# Patient Record
Sex: Male | Born: 1985 | Race: Black or African American | Hispanic: No | Marital: Single | Smoking: Never smoker
Health system: Southern US, Community
[De-identification: ages and names within clinical notes are randomized; demographics above are authoritative.]

## PROBLEM LIST (undated history)

## (undated) DIAGNOSIS — M869 Osteomyelitis, unspecified: Secondary | ICD-10-CM

## (undated) DIAGNOSIS — D571 Sickle-cell disease without crisis: Secondary | ICD-10-CM

---
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
## 2007-09-01 ENCOUNTER — Emergency Department (HOSPITAL_COMMUNITY): Admission: EM | Admit: 2007-09-01 | Discharge: 2007-09-01 | Payer: Self-pay | Admitting: Emergency Medicine

## 2012-01-04 ENCOUNTER — Emergency Department (HOSPITAL_COMMUNITY)
Admission: EM | Admit: 2012-01-04 | Discharge: 2012-01-05 | Disposition: A | Discharge status: Home / Self Care | Payer: Self-pay | Attending: Emergency Medicine | Admitting: Emergency Medicine

## 2012-01-04 ENCOUNTER — Encounter (HOSPITAL_COMMUNITY): Payer: Self-pay | Admitting: Emergency Medicine

## 2012-01-04 ENCOUNTER — Emergency Department (HOSPITAL_COMMUNITY): Payer: Self-pay

## 2012-01-04 DIAGNOSIS — G8929 Other chronic pain: Secondary | ICD-10-CM | POA: Insufficient documentation

## 2012-01-04 DIAGNOSIS — M25559 Pain in unspecified hip: Secondary | ICD-10-CM | POA: Insufficient documentation

## 2012-01-04 DIAGNOSIS — D571 Sickle-cell disease without crisis: Secondary | ICD-10-CM | POA: Insufficient documentation

## 2012-01-04 DIAGNOSIS — M25569 Pain in unspecified knee: Secondary | ICD-10-CM | POA: Insufficient documentation

## 2012-01-04 HISTORY — DX: Sickle-cell disease without crisis: D57.1

## 2012-01-04 HISTORY — DX: Osteomyelitis, unspecified: M86.9

## 2012-01-04 NOTE — ED Notes (Signed)
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  St's has had pain since then unless he is on pain meds.  St's it hurts when he breaths

## 2012-01-04 NOTE — ED Notes (Signed)
Pt c/o pain to left knee.  Pt sleeping.

## 2012-01-05 MED ORDER — NAPROXEN 500 MG PO TABS: 500.0000 mg | ORAL_TABLET | Freq: Two times a day (BID) | ORAL | Status: AC

## 2012-01-05 MED ORDER — KETOROLAC TROMETHAMINE 60 MG/2ML IM SOLN
30.0000 mg | Freq: Once | INTRAMUSCULAR | Status: AC
  Administered 2012-01-05: 30 mg via INTRAMUSCULAR
  Filled 2012-01-05: qty 2

## 2012-01-05 NOTE — ED Notes (Signed)
Ortho tech here to apply knee sleeve.

## 2012-01-05 NOTE — ED Provider Notes (Signed)
History     CSN: [id number]  Arrival date & time 01/04/12  2047   First MD Initiated Contact with Patient 01/05/12 0005      Chief Complaint  Patient presents with  . Knee Pain    (Consider location/radiation/quality/duration/timing/severity/associated sxs/prior treatment) HPI Comments: Patient states he has chronic left knee pain.  One year ago was told he had osteomyelitis in the pain has been consistent daily walks.  Pain radiates to his hips.  He also states when he breathes.  He can feel pain in the veins of his left he buys Percocet off the street, which helps has no primary care doctor, although he has been in [place] for 6 years  Patient is a 26 y.o. male presenting with knee pain. The history is provided by the patient.  Knee Pain This is a chronic problem. The current episode started more than 1 year ago. The problem occurs constantly. The problem has been unchanged. Pertinent negatives include no coughing, joint swelling, numbness or weakness. The symptoms are aggravated by exertion. The treatment provided no relief.    Past Medical History  Diagnosis Date  . Osteomyelitis   . Sickle cell anemia     History reviewed. No pertinent past surgical history.  No family history on file.  History  Substance Use Topics  . Smoking status: Never Smoker   . Smokeless tobacco: Not on file  . Alcohol Use: Yes     sometimes      Review of Systems  Constitutional: Negative for activity change.  HENT: Negative.   Respiratory: Negative for cough.   Cardiovascular: Negative for leg swelling.  Musculoskeletal: Negative for joint swelling.  Neurological: Negative for weakness and numbness.  Hematological: Negative.     Allergies  Review of patient's allergies indicates no known allergies.  Home Medications   Current Outpatient Rx  Name Route Sig Dispense Refill  . NAPROXEN 500 MG PO TABS Oral Take 1 tablet (500 mg total) by mouth 2 (two) times daily. 30 tablet 0     BP 124/86  Pulse 115  Temp(Src) 99.2 F (37.3 C) (Oral)  Resp 20  SpO2 95%  Physical Exam  Constitutional: He is oriented to person, place, and time. He appears well-developed.  HENT:  Head: Normocephalic.  Neck: Normal range of motion.  Cardiovascular: Normal rate.   Pulmonary/Chest: Effort normal.  Musculoskeletal: Normal range of motion. He exhibits no edema and no tenderness.  Neurological: He is alert and oriented to person, place, and time.  Skin: Skin is warm.  Psychiatric: He has a normal mood and affect.    ED Course  Procedures (including critical care time)  Labs Reviewed - No data to display Dg Knee Complete 4 Views Left  01/04/2012  *RADIOLOGY REPORT*  Clinical Data: Left knee pain.  History of osteomyelitis and sickle cell anemia.  LEFT KNEE - COMPLETE 4+ VIEW  Comparison: None.  Findings: Alignment of the left knee is anatomic.  There is deformity of the lateral tibial plateau, suggesting prior healed tibial plateau fracture.  No effusion.  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  There is some cortical irregularity that may represent old fracture or non-ossifying fibroma/benign fibroxanthoma.  Medial joint space preserved.  IMPRESSION:  1.  Deformity of the lateral tibial plateau, suggesting healed tibial plateau fracture. 2.  Sclerosis of the distal femur with cortical irregularity. Differential considerations include healed fracture, bone infarcts associated with sickle cell and / or healed benign fibroxanthoma. 3.  No acute osseous abnormality.  Original Report Authenticated By: Andreas Newport, M.D.     1. Chronic knee pain     X-rays negative for any acute pathology.  On exam, there is no swelling, no crepitus, no deformity.  No erythema or for patient to orthopedics for further evaluation.  His chronic knee pain  MDM  Chronic knee pain        Arman Filter, NP 01/05/12 0102  Arman Filter, NP 01/05/12 1610

## 2012-01-13 NOTE — ED Provider Notes (Signed)
Medical screening examination/treatment/procedure(s) were performed by non-physician practitioner and as supervising physician I was immediately available for consultation/collaboration.    Akili Cuda L Ryan Ogborn, MD 01/13/12 2249 

## 2013-06-12 LAB — PROCEDURE REPORT - SCANNED: Pap: NEGATIVE

## 2016-09-20 ENCOUNTER — Encounter: Payer: Self-pay | Admitting: *Deleted

## 2019-10-13 ENCOUNTER — Emergency Department (HOSPITAL_COMMUNITY): Payer: Self-pay

## 2019-10-13 ENCOUNTER — Inpatient Hospital Stay (HOSPITAL_COMMUNITY)
Admission: EM | Admit: 2019-10-13 | Discharge: 2019-10-16 | DRG: 872 | Disposition: A | Discharge status: Home / Self Care | Payer: Self-pay | Attending: Internal Medicine | Admitting: Internal Medicine

## 2019-10-13 ENCOUNTER — Encounter (HOSPITAL_COMMUNITY): Payer: Self-pay

## 2019-10-13 ENCOUNTER — Other Ambulatory Visit: Payer: Self-pay

## 2019-10-13 DIAGNOSIS — M542 Cervicalgia: Secondary | ICD-10-CM | POA: Diagnosis present

## 2019-10-13 DIAGNOSIS — D509 Iron deficiency anemia, unspecified: Secondary | ICD-10-CM | POA: Diagnosis present

## 2019-10-13 DIAGNOSIS — D5701 Hb-SS disease with acute chest syndrome: Secondary | ICD-10-CM | POA: Insufficient documentation

## 2019-10-13 DIAGNOSIS — D72819 Decreased white blood cell count, unspecified: Secondary | ICD-10-CM | POA: Diagnosis present

## 2019-10-13 DIAGNOSIS — Z5900 Homelessness unspecified: Secondary | ICD-10-CM

## 2019-10-13 DIAGNOSIS — A419 Sepsis, unspecified organism: Principal | ICD-10-CM | POA: Diagnosis present

## 2019-10-13 DIAGNOSIS — K0889 Other specified disorders of teeth and supporting structures: Secondary | ICD-10-CM | POA: Diagnosis present

## 2019-10-13 DIAGNOSIS — D649 Anemia, unspecified: Secondary | ICD-10-CM

## 2019-10-13 DIAGNOSIS — Z20828 Contact with and (suspected) exposure to other viral communicable diseases: Secondary | ICD-10-CM | POA: Diagnosis present

## 2019-10-13 DIAGNOSIS — D573 Sickle-cell trait: Secondary | ICD-10-CM | POA: Diagnosis present

## 2019-10-13 DIAGNOSIS — Z20822 Contact with and (suspected) exposure to covid-19: Secondary | ICD-10-CM | POA: Insufficient documentation

## 2019-10-13 LAB — SARS CORONAVIRUS 2 (TAT 6-24 HRS): SARS Coronavirus 2: NEGATIVE

## 2019-10-13 LAB — CBC WITH DIFFERENTIAL/PLATELET
Abs Immature Granulocytes: 0.01 10*3/uL (ref 0.00–0.07)
Basophils Absolute: 0 10*3/uL (ref 0.0–0.1)
Basophils Relative: 0 %
Eosinophils Absolute: 0 10*3/uL (ref 0.0–0.5)
Eosinophils Relative: 0 %
HCT: 20.2 % — ABNORMAL LOW (ref 39.0–52.0)
Hemoglobin: 5.2 g/dL — CL (ref 13.0–17.0)
Immature Granulocytes: 0 %
Lymphocytes Relative: 5 %
Lymphs Abs: 0.1 10*3/uL — ABNORMAL LOW (ref 0.7–4.0)
MCH: 17.1 pg — ABNORMAL LOW (ref 26.0–34.0)
MCHC: 25.7 g/dL — ABNORMAL LOW (ref 30.0–36.0)
MCV: 66.4 fL — ABNORMAL LOW (ref 80.0–100.0)
Monocytes Absolute: 0 10*3/uL — ABNORMAL LOW (ref 0.1–1.0)
Monocytes Relative: 1 %
Neutro Abs: 2.5 10*3/uL (ref 1.7–7.7)
Neutrophils Relative %: 94 %
Platelets: 247 10*3/uL (ref 150–400)
RBC: 3.04 MIL/uL — ABNORMAL LOW (ref 4.22–5.81)
RDW: 22.5 % — ABNORMAL HIGH (ref 11.5–15.5)
WBC: 2.7 10*3/uL — ABNORMAL LOW (ref 4.0–10.5)
nRBC: 4.1 % — ABNORMAL HIGH (ref 0.0–0.2)

## 2019-10-13 LAB — URINALYSIS, ROUTINE W REFLEX MICROSCOPIC
Bilirubin Urine: NEGATIVE
Glucose, UA: NEGATIVE mg/dL
Hgb urine dipstick: NEGATIVE
Ketones, ur: NEGATIVE mg/dL
Leukocytes,Ua: NEGATIVE
Nitrite: NEGATIVE
Protein, ur: NEGATIVE mg/dL
Specific Gravity, Urine: 1.013 (ref 1.005–1.030)
pH: 6 (ref 5.0–8.0)

## 2019-10-13 LAB — HEPATIC FUNCTION PANEL
ALT: 20 U/L (ref 0–44)
AST: 79 U/L — ABNORMAL HIGH (ref 15–41)
Albumin: 3.4 g/dL — ABNORMAL LOW (ref 3.5–5.0)
Alkaline Phosphatase: 45 U/L (ref 38–126)
Bilirubin, Direct: 0.1 mg/dL (ref 0.0–0.2)
Indirect Bilirubin: 0.5 mg/dL (ref 0.3–0.9)
Total Bilirubin: 0.6 mg/dL (ref 0.3–1.2)
Total Protein: 6.4 g/dL — ABNORMAL LOW (ref 6.5–8.1)

## 2019-10-13 LAB — IRON AND TIBC
Iron: 11 ug/dL — ABNORMAL LOW (ref 45–182)
Saturation Ratios: 3 % — ABNORMAL LOW (ref 17.9–39.5)
TIBC: 419 ug/dL (ref 250–450)
UIBC: 408 ug/dL

## 2019-10-13 LAB — RETICULOCYTES
Immature Retic Fract: 18 % — ABNORMAL HIGH (ref 2.3–15.9)
RBC.: 2.99 MIL/uL — ABNORMAL LOW (ref 4.22–5.81)
Retic Count, Absolute: 20.6 10*3/uL (ref 19.0–186.0)
Retic Ct Pct: 0.7 % (ref 0.4–3.1)

## 2019-10-13 LAB — PREPARE RBC (CROSSMATCH)

## 2019-10-13 LAB — BASIC METABOLIC PANEL
Anion gap: 11 (ref 5–15)
BUN: 12 mg/dL (ref 6–20)
CO2: 21 mmol/L — ABNORMAL LOW (ref 22–32)
Calcium: 7.8 mg/dL — ABNORMAL LOW (ref 8.9–10.3)
Chloride: 107 mmol/L (ref 98–111)
Creatinine, Ser: 0.91 mg/dL (ref 0.61–1.24)
GFR calc Af Amer: >60 mL/min (ref >60)
GFR calc non Af Amer: >60 mL/min (ref >60)
Glucose, Bld: 74 mg/dL (ref 70–99)
Potassium: 4 mmol/L (ref 3.5–5.1)
Sodium: 139 mmol/L (ref 135–145)

## 2019-10-13 LAB — LACTIC ACID, PLASMA
Lactic Acid, Venous: 2.6 mmol/L (ref 0.5–1.9)
Lactic Acid, Venous: 2.4 mmol/L (ref 0.5–1.9)

## 2019-10-13 LAB — FOLATE: Folate: 8.3 ng/mL (ref >5.9)

## 2019-10-13 LAB — POC OCCULT BLOOD, ED: Fecal Occult Bld: NEGATIVE

## 2019-10-13 LAB — PROTIME-INR
INR: 1.9 — ABNORMAL HIGH (ref 0.8–1.2)
Prothrombin Time: 21.6 seconds — ABNORMAL HIGH (ref 11.4–15.2)

## 2019-10-13 LAB — VITAMIN B12: Vitamin B-12: 338 pg/mL (ref 180–914)

## 2019-10-13 LAB — FERRITIN: Ferritin: 63 ng/mL (ref 24–336)

## 2019-10-13 LAB — LIPASE, BLOOD: Lipase: 39 U/L (ref 11–51)

## 2019-10-13 LAB — APTT: aPTT: 30 seconds (ref 24–36)

## 2019-10-13 LAB — ABO/RH: ABO/RH(D): O POS

## 2019-10-13 MED ORDER — AZITHROMYCIN 250 MG PO TABS
500.0000 mg | ORAL_TABLET | Freq: Every day | ORAL | Status: DC
  Administered 2019-10-14 – 2019-10-16 (×3): 500 mg via ORAL
  Filled 2019-10-13 (×3): qty 2

## 2019-10-13 MED ORDER — ENOXAPARIN SODIUM 40 MG/0.4ML ~~LOC~~ SOLN
40.0000 mg | SUBCUTANEOUS | Status: DC
  Filled 2019-10-13 (×2): qty 0.4

## 2019-10-13 MED ORDER — MORPHINE SULFATE (PF) 4 MG/ML IV SOLN: 4.0000 mg | Freq: Once | INTRAVENOUS | Status: DC

## 2019-10-13 MED ORDER — ACETAMINOPHEN 325 MG PO TABS
650.0000 mg | ORAL_TABLET | Freq: Once | ORAL | Status: AC
  Administered 2019-10-13: 11:00:00 650 mg via ORAL
  Filled 2019-10-13: qty 2

## 2019-10-13 MED ORDER — SODIUM CHLORIDE 0.9 % IV SOLN
1.0000 g | Freq: Once | INTRAVENOUS | Status: AC
  Administered 2019-10-13: 12:00:00 1 g via INTRAVENOUS
  Filled 2019-10-13: qty 10

## 2019-10-13 MED ORDER — SODIUM CHLORIDE 0.9 % IV SOLN
1.0000 g | INTRAVENOUS | Status: DC
  Administered 2019-10-14 – 2019-10-15 (×2): 1 g via INTRAVENOUS
  Filled 2019-10-13 (×2): qty 1
  Filled 2019-10-13: qty 10

## 2019-10-13 MED ORDER — ONDANSETRON HCL 4 MG/2ML IJ SOLN
4.0000 mg | Freq: Once | INTRAMUSCULAR | Status: AC
  Administered 2019-10-13: 4 mg via INTRAVENOUS
  Filled 2019-10-13: qty 2

## 2019-10-13 MED ORDER — SODIUM CHLORIDE 0.9% IV SOLUTION: Freq: Once | INTRAVENOUS | Status: DC

## 2019-10-13 MED ORDER — PROMETHAZINE HCL 25 MG PO TABS: 12.5000 mg | ORAL_TABLET | ORAL | Status: DC | PRN

## 2019-10-13 MED ORDER — BENZOCAINE 10 % MT GEL
Freq: Four times a day (QID) | OROMUCOSAL | Status: DC | PRN
  Filled 2019-10-13: qty 9.4

## 2019-10-13 MED ORDER — KETOROLAC TROMETHAMINE 15 MG/ML IJ SOLN
15.0000 mg | Freq: Four times a day (QID) | INTRAMUSCULAR | Status: DC
  Administered 2019-10-13 – 2019-10-15 (×6): 15 mg via INTRAVENOUS
  Filled 2019-10-13 (×10): qty 1

## 2019-10-13 MED ORDER — LIDOCAINE VISCOUS HCL 2 % MT SOLN
15.0000 mL | Freq: Once | OROMUCOSAL | Status: AC
  Administered 2019-10-13: 15 mL via OROMUCOSAL
  Filled 2019-10-13: qty 15

## 2019-10-13 MED ORDER — AZITHROMYCIN 250 MG PO TABS
500.0000 mg | ORAL_TABLET | Freq: Once | ORAL | Status: AC
  Administered 2019-10-13: 12:00:00 500 mg via ORAL
  Filled 2019-10-13: qty 2

## 2019-10-13 MED ORDER — SODIUM CHLORIDE (PF) 0.9 % IJ SOLN
INTRAMUSCULAR | Status: AC
  Filled 2019-10-13: qty 50

## 2019-10-13 MED ORDER — PROMETHAZINE HCL 25 MG RE SUPP: 12.5000 mg | RECTAL | Status: DC | PRN

## 2019-10-13 MED ORDER — SODIUM CHLORIDE 0.9 % IV BOLUS
1000.0000 mL | Freq: Once | INTRAVENOUS | Status: AC
  Administered 2019-10-13: 11:00:00 1000 mL via INTRAVENOUS

## 2019-10-13 MED ORDER — FOLIC ACID 1 MG PO TABS
1.0000 mg | ORAL_TABLET | Freq: Every day | ORAL | Status: DC
  Administered 2019-10-14 – 2019-10-16 (×3): 1 mg via ORAL
  Filled 2019-10-13 (×3): qty 1

## 2019-10-13 MED ORDER — IOHEXOL 300 MG/ML  SOLN
100.0000 mL | Freq: Once | INTRAMUSCULAR | Status: AC | PRN
  Administered 2019-10-13: 100 mL via INTRAVENOUS

## 2019-10-13 MED ORDER — SODIUM CHLORIDE 0.9% IV SOLUTION
Freq: Once | INTRAVENOUS | Status: AC
  Administered 2019-10-13: 22:00:00 via INTRAVENOUS

## 2019-10-13 MED ORDER — SODIUM CHLORIDE 0.45 % IV SOLN
INTRAVENOUS | Status: DC
  Administered 2019-10-13 – 2019-10-15 (×3): via INTRAVENOUS

## 2019-10-13 MED ORDER — POLYETHYLENE GLYCOL 3350 17 G PO PACK: 17.0000 g | PACK | Freq: Every day | ORAL | Status: DC | PRN

## 2019-10-13 MED ORDER — SENNOSIDES-DOCUSATE SODIUM 8.6-50 MG PO TABS
1.0000 | ORAL_TABLET | Freq: Two times a day (BID) | ORAL | Status: DC
  Administered 2019-10-14 – 2019-10-16 (×5): 1 via ORAL
  Filled 2019-10-13 (×6): qty 1

## 2019-10-13 MED ORDER — ONDANSETRON HCL 4 MG/2ML IJ SOLN
INTRAMUSCULAR | Status: AC
  Filled 2019-10-13: qty 2

## 2019-10-13 NOTE — ED Triage Notes (Signed)
He c/o feeling "cold" x 6 hours. He seems angry and reluctant to cooperate. He is in no distress. He is quite rude. PA with pt. As I write this.

## 2019-10-13 NOTE — H&P (Signed)
H&P  Patient Demographics:  Alfred Phillips, is a 33 y.o. male  MRN: [id number]   DOB - Sep 20, 1986  Admit Date - 10/13/2019  Outpatient Primary MD for the patient is Patient, No Pcp Per  Chief Complaint  Patient presents with  . possible covid-19     HPI:   Alfred Phillips  is a 33 y.o. male patient with possible history of sickle cell disease, patient unsure if he has sickle cell trait or sickle cell anemia but according to him has always had low hemoglobin.  Has not been to the doctor for over 10 years.  He presented to the emergency room today with symptoms of fever, chills, generalized body ache, pains, sore throat, and diffuse abdominal pain, associated with nausea, vomiting and decreased in taste and smell.  He is very sure he has not been in contact with any patient diagnosed with COVID-19, but unsure of exposure to asymptomatic and unconfirmed diagnosis.  He denies any recent travel.  He does not have cough or shortness of breath.  He denies any urinary symptom or change in bowel habit.  He is not on any medication.  He claims when he was younger he used to be given some herbal concoctions to bring up his hemoglobin level. Patient is borderline homeless, per EMS he was picked up at a laundromat.   ED course: Patient was found to be anemic with hemoglobin of 5.2.  There was no prior hemoglobin level for comparison.  White cell cell count was low at 2.7. Patient was febrile with T max of 103*F, soft BP of 95/60, HR of 105, initial Lactic Acid was 2.6. Patient will be admitted for SIRS, symptomatic Iron Def Anemia, and possible sickle cell disease.   Review of systems:  In addition to the HPI above, patient reports No Headache, No changes with vision or hearing No problems swallowing food or liquids No chest pain, cough or shortness of breath No blood in stool or urine No dysuria No new skin rashes or bruises No new joints pains-aches No new weakness, tingling, numbness in any extremity No  recent weight gain or loss No polyuria, polydypsia or polyphagia No significant Mental Stressors  A full 10 point Review of Systems was done, except as stated above, all other Review of Systems were negative.  With Past History of the following :   Past Medical History:  Diagnosis Date  . Osteomyelitis (HCC)   . Sickle cell anemia (HCC)       No past surgical history on file.   Social History:   Social History   Tobacco Use  . Smoking status: Never Smoker  Substance Use Topics  . Alcohol use: Yes    Comment: sometimes     Lives - At home   Family History :   No family history on file.   Home Medications:   Prior to Admission medications   Not on File     Allergies:   No Known Allergies   Physical Exam:   Vitals:   Vitals:   10/13/19 1301 10/13/19 1544  BP: 95/60 118/81  Pulse: (!) 105 93  Resp: 18 18  Temp:    SpO2: 100% 100%   Physical Exam: Constitutional: Patient appears well-developed and well-nourished. Not in obvious distress. Pale, warm to touch HENT: Normocephalic, atraumatic, External right and left ear normal. Oropharynx is clear and moist.  Eyes: Conjunctivae and EOM are normal. PERRLA, no scleral icterus. Neck: Normal ROM. Neck supple. No JVD. No  tracheal deviation. No thyromegaly. CVS: RRR, S1/S2 +, no murmurs, no gallops, no carotid bruit.  Pulmonary: Effort and breath sounds normal, no stridor, rhonchi, wheezes, rales.  Abdominal: Soft. BS +, no distension, tenderness, rebound or guarding.  Musculoskeletal: Normal range of motion. No edema and no tenderness.  Lymphadenopathy: No lymphadenopathy noted, cervical, inguinal or axillary Neuro: Alert. Normal reflexes, muscle tone coordination. No cranial nerve deficit. Skin: Skin is warm and dry. No rash noted. Not diaphoretic. No erythema. No pallor. Psychiatric: Normal mood and affect. Behavior, judgment, thought content normal.   Data Review:   CBC Recent Labs  Lab 10/13/19 1046   WBC 2.7*  HGB 5.2*  HCT 20.2*  PLT 247  MCV 66.4*  MCH 17.1*  MCHC 25.7*  RDW 22.5*  LYMPHSABS 0.1*  MONOABS 0.0*  EOSABS 0.0  BASOSABS 0.0   ------------------------------------------------------------------------------------------------------------------  Chemistries  Recent Labs  Lab 10/13/19 1046  NA 139  K 4.0  CL 107  CO2 21*  GLUCOSE 74  BUN 12  CREATININE 0.91  CALCIUM 7.8*   ------------------------------------------------------------------------------------------------------------------ CrCl cannot be calculated (Unknown ideal weight.). ------------------------------------------------------------------------------------------------------------------ No results for input(s): TSH, T4TOTAL, T3FREE, THYROIDAB in the last 72 hours.  Invalid input(s): FREET3  Coagulation profile No results for input(s): INR, PROTIME in the last 168 hours. ------------------------------------------------------------------------------------------------------------------- No results for input(s): DDIMER in the last 72 hours. -------------------------------------------------------------------------------------------------------------------  Cardiac Enzymes No results for input(s): CKMB, TROPONINI, MYOGLOBIN in the last 168 hours.  Invalid input(s): CK ------------------------------------------------------------------------------------------------------------------ No results found for: BNP  ---------------------------------------------------------------------------------------------------------------  Urinalysis    Component Value Date/Time   COLORURINE YELLOW 10/13/2019 1004   APPEARANCEUR CLEAR 10/13/2019 1004   LABSPEC 1.013 10/13/2019 1004   PHURINE 6.0 10/13/2019 1004   GLUCOSEU NEGATIVE 10/13/2019 1004   HGBUR NEGATIVE 10/13/2019 1004   BILIRUBINUR NEGATIVE 10/13/2019 1004   KETONESUR NEGATIVE 10/13/2019 1004   PROTEINUR NEGATIVE 10/13/2019 1004   NITRITE NEGATIVE  10/13/2019 1004   LEUKOCYTESUR NEGATIVE 10/13/2019 1004    ----------------------------------------------------------------------------------------------------------------   Imaging Results:    Ct Abdomen Pelvis W Contrast  Result Date: 10/13/2019 CLINICAL DATA:  33 year old male with history of diffuse abdominal pain with nausea and vomiting. Fever to 103 degrees. Rectal bleeding. EXAM: CT ABDOMEN AND PELVIS WITH CONTRAST TECHNIQUE: Multidetector CT imaging of the abdomen and pelvis was performed using the standard protocol following bolus administration of intravenous contrast. CONTRAST:  OMNIPAQUE IOHEXOL 300 MG/ML  SOLN COMPARISON:  No priors. FINDINGS: Lower chest: Unremarkable. Hepatobiliary: No suspicious cystic or solid hepatic lesions. No intra or extrahepatic biliary ductal dilatation. Gallbladder is normal in appearance. Pancreas: No pancreatic mass. No pancreatic ductal dilatation. No pancreatic or peripancreatic fluid collections or inflammatory changes. Spleen: Unremarkable. Adrenals/Urinary Tract: Bilateral kidneys and adrenal glands are normal in appearance. No hydroureteronephrosis. Urinary bladder is normal in appearance. Stomach/Bowel: Normal appearance of the stomach. No pathologic dilatation of small bowel or colon. The appendix is not confidently identified and may be surgically absent. Regardless, there are no inflammatory changes noted adjacent to the cecum to suggest the presence of an acute appendicitis at this time. Prominent hemorrhoidal veins adjacent to the distal rectum. Vascular/Lymphatic: No significant atherosclerotic disease, aneurysm or dissection noted in the abdominal or pelvic vasculature. No lymphadenopathy noted in the abdomen or pelvis. Reproductive: Prostate gland and seminal vesicles are unremarkable in appearance. Other: No significant volume of ascites.  No pneumoperitoneum. Musculoskeletal: There are no aggressive appearing lytic or blastic lesions  noted in the visualized portions of the skeleton. IMPRESSION: 1. No acute  findings are noted to account for the patient's abdominal pain, nausea or vomiting. 2. There are prominent hemorrhoidal veins adjacent to the distal rectum, which may account for the patient's rectal bleeding. Electronically Signed   By: Trudie Reed M.D.   On: 10/13/2019 13:05   Dg Chest Portable 1 View  Result Date: 10/13/2019 CLINICAL DATA:  Fever, chills, headache, nausea/vomiting EXAM: PORTABLE CHEST 1 VIEW COMPARISON:  None. FINDINGS: Lungs are clear.  No pleural effusion or pneumothorax. The heart is normal in size. IMPRESSION: No evidence of acute cardiopulmonary disease. Electronically Signed   By: Charline Bills M.D.   On: 10/13/2019 10:37    Assessment & Plan:  Active Problems:   Sepsis (HCC)   Symptomatic anemia  1. Symptomatic Iron Deficiency Anemia: After much education and discussion of benefits and risks, patient gave informed consent to blood transfusion. Patient will be transfused with 2 units of PRBC. Monitor closely. Folic acid and Iron. 2. Systemic Inflammatory Response Syndrome: Patient has fever, leucopenia, tachycardia and tachypnea. COVID19 testing ordered, blood and urine cultures, CXR. Start empiric antibiotics; IV Ceftriaxone and Azithromycin. Monitor vitals closely, repeat lactic acid and CBCD 3. Possible Sickle Cell Disease: Hemoglobinopathy Screening ordered. Patient unsure of diagnosis, he told me it could be sickle cell trait but he use to be admitted frequently as a child and later treated with herbal remedies to raise his blood level and has since been stable. Not being to a doctor for over 10 years and not on any medication. IVF D5 .45% Saline, prn pain control, acetaminophen for fever. Monitor vitals very closely, Re-evaluate pain scale regularly, 2 L of Oxygen by Gramling, Patient will be re-evaluated for pain in the context of function and relationship to baseline as care  progresses. 4. Leukopenia: Check HIV,COVID19, recheck CBCD. IV antibiotics meanwhile 5. Homelessness: Child psychotherapist consult  DVT Prophylaxis: Subcut Lovenox   AM Labs Ordered, also please review Full Orders  Family Communication: Admission, patient's condition and plan of care including tests being ordered have been discussed with the patient who indicate understanding and agree with the plan and Code Status.  Code Status: Full Code  Consults called: None    Admission status: Inpatient    Time spent in minutes : 50 minutes  Jeanann Lewandowsky MD, MHA, CPE, FACP 10/13/2019 at 3:52 PM

## 2019-10-13 NOTE — ED Provider Notes (Addendum)
[facility] DEPT Provider Note   CSN: [id number] Arrival date & time: 10/13/19  7654     History   Chief Complaint Chief Complaint  Patient presents with   possible covid-19    HPI Alfred Phillips is a 33 y.o. male.     The history is provided by the patient. No language interpreter was used.     33 year old male with history of sickle cell disease brought here via EMS from a laundromat with symptoms concerning for COVID-19.  History is difficult to obtain as patient is uncooperative.  Patient report for the past 1 day he has had fever, chills, body ache, sore throat, dehydration, diffuse abdominal discomfort, nausea, vomiting and decrease in taste and smell.  He is unsure if he has been around anyone that test positive COVID-19.  He denies any recent travel.  He does not complain of any shortness of breath.  He is insistent on having something to drink.  He denies any dysuria.  No specific treatment tried.  Past Medical History:  Diagnosis Date   Osteomyelitis    Sickle cell anemia     There are no active problems to display for this patient.   No past surgical history on file.      Home Medications    Prior to Admission medications   Not on File    Family History No family history on file.  Social History Social History   Tobacco Use   Smoking status: Never Smoker  Substance Use Topics   Alcohol use: Yes    Comment: sometimes   Drug use: No     Allergies   Patient has no known allergies.   Review of Systems Review of Systems  All other systems reviewed and are negative.    Physical Exam Updated Vital Signs BP (!) 110/54    Pulse (!) 119    Temp (!) 103 F (39.4 C) (Oral)    SpO2 99%   Physical Exam Vitals signs and nursing note reviewed.  Constitutional:      General: He is not in acute distress.    Appearance: He is well-developed.     Comments: Appears uncomfortable but nontoxic  HENT:     Head:  Atraumatic.  Eyes:     Conjunctiva/sclera: Conjunctivae normal.  Neck:     Musculoskeletal: Normal range of motion and neck supple. No neck rigidity.  Cardiovascular:     Rate and Rhythm: Tachycardia present.     Pulses: Normal pulses.     Heart sounds: Normal heart sounds.  Pulmonary:     Effort: Pulmonary effort is normal.     Breath sounds: Normal breath sounds. No wheezing, rhonchi or rales.  Abdominal:     Palpations: Abdomen is soft.     Tenderness: There is abdominal tenderness (Mild diffuse abdominal tenderness without guarding or rebound tenderness.  Negative Murphy sign, no pain at McBurney's point.).  Genitourinary:    Comments: Chaperone present during exam.  Normal rectal tone, no obvious mass, no thrombosed hemorrhoid, palpable internal hemorrhoid, no visible hemorrhoid, no blood on glove. Skin:    General: Skin is warm.     Findings: No rash.  Neurological:     Mental Status: He is alert and oriented to person, place, and time.  Psychiatric:     Comments: Agitated.      ED Treatments / Results  Labs (all labs ordered are listed, but only abnormal results are displayed) Labs Reviewed  CBC WITH  DIFFERENTIAL/PLATELET - Abnormal; Notable for the following components:      Result Value   WBC 2.7 (*)    RBC 3.04 (*)    Hemoglobin 5.2 (*)    HCT 20.2 (*)    MCV 66.4 (*)    MCH 17.1 (*)    MCHC 25.7 (*)    RDW 22.5 (*)    nRBC 4.1 (*)    Lymphs Abs 0.1 (*)    Monocytes Absolute 0.0 (*)    All other components within normal limits  BASIC METABOLIC PANEL - Abnormal; Notable for the following components:   CO2 21 (*)    Calcium 7.8 (*)    All other components within normal limits  LACTIC ACID, PLASMA - Abnormal; Notable for the following components:   Lactic Acid, Venous 2.6 (*)    All other components within normal limits  LACTIC ACID, PLASMA - Abnormal; Notable for the following components:   Lactic Acid, Venous 2.4 (*)    All other components within  normal limits  RETICULOCYTES - Abnormal; Notable for the following components:   RBC. 2.99 (*)    Immature Retic Fract 18.0 (*)    All other components within normal limits  SARS CORONAVIRUS 2 (TAT 6-24 HRS)  LIPASE, BLOOD  URINALYSIS, ROUTINE W REFLEX MICROSCOPIC  POC OCCULT BLOOD, ED    EKG None  Radiology Ct Abdomen Pelvis W Contrast  Result Date: 10/13/2019 CLINICAL DATA:  33 year old male with history of diffuse abdominal pain with nausea and vomiting. Fever to 103 degrees. Rectal bleeding. EXAM: CT ABDOMEN AND PELVIS WITH CONTRAST TECHNIQUE: Multidetector CT imaging of the abdomen and pelvis was performed using the standard protocol following bolus administration of intravenous contrast. CONTRAST:  OMNIPAQUE IOHEXOL 300 MG/ML  SOLN COMPARISON:  No priors. FINDINGS: Lower chest: Unremarkable. Hepatobiliary: No suspicious cystic or solid hepatic lesions. No intra or extrahepatic biliary ductal dilatation. Gallbladder is normal in appearance. Pancreas: No pancreatic mass. No pancreatic ductal dilatation. No pancreatic or peripancreatic fluid collections or inflammatory changes. Spleen: Unremarkable. Adrenals/Urinary Tract: Bilateral kidneys and adrenal glands are normal in appearance. No hydroureteronephrosis. Urinary bladder is normal in appearance. Stomach/Bowel: Normal appearance of the stomach. No pathologic dilatation of small bowel or colon. The appendix is not confidently identified and may be surgically absent. Regardless, there are no inflammatory changes noted adjacent to the cecum to suggest the presence of an acute appendicitis at this time. Prominent hemorrhoidal veins adjacent to the distal rectum. Vascular/Lymphatic: No significant atherosclerotic disease, aneurysm or dissection noted in the abdominal or pelvic vasculature. No lymphadenopathy noted in the abdomen or pelvis. Reproductive: Prostate gland and seminal vesicles are unremarkable in appearance. Other: No  significant volume of ascites.  No pneumoperitoneum. Musculoskeletal: There are no aggressive appearing lytic or blastic lesions noted in the visualized portions of the skeleton. IMPRESSION: 1. No acute findings are noted to account for the patient's abdominal pain, nausea or vomiting. 2. There are prominent hemorrhoidal veins adjacent to the distal rectum, which may account for the patient's rectal bleeding. Electronically Signed   By: Trudie Reed M.D.   On: 10/13/2019 13:05   Dg Chest Portable 1 View  Result Date: 10/13/2019 CLINICAL DATA:  Fever, chills, headache, nausea/vomiting EXAM: PORTABLE CHEST 1 VIEW COMPARISON:  None. FINDINGS: Lungs are clear.  No pleural effusion or pneumothorax. The heart is normal in size. IMPRESSION: No evidence of acute cardiopulmonary disease. Electronically Signed   By: Charline Bills M.D.   On: 10/13/2019 10:37  Procedures .Critical Care Performed by: Fayrene Helperran, Faolan [place], PA-C Authorized by: Fayrene Helperran, Kross Swallows, PA-C   Critical care provider statement:    Critical care time (minutes):  45   Critical care was time spent personally by me on the following activities:  Discussions with consultants, evaluation of patient's response to treatment, examination of patient, ordering and performing treatments and interventions, ordering and review of laboratory studies, ordering and review of radiographic studies, pulse oximetry, re-evaluation of patient's condition, obtaining history from patient or surrogate and review of old charts   (including critical care time)  Medications Ordered in ED Medications  0.9 %  sodium chloride infusion (Manually program via Guardrails IV Fluids) (has no administration in time range)  sodium chloride (PF) 0.9 % injection (has no administration in time range)  sodium chloride 0.9 % bolus 1,000 mL (0 mLs Intravenous Stopped 10/13/19 1155)  acetaminophen (TYLENOL) tablet 650 mg (650 mg Oral Given 10/13/19 1048)  ondansetron (ZOFRAN)  injection 4 mg (4 mg Intravenous Given 10/13/19 1054)  lidocaine (XYLOCAINE) 2 % viscous mouth solution 15 mL (15 mLs Mouth/Throat Given 10/13/19 1048)  cefTRIAXone (ROCEPHIN) 1 g in sodium chloride 0.9 % 100 mL IVPB (0 g Intravenous Stopped 10/13/19 1307)  azithromycin (ZITHROMAX) tablet 500 mg (500 mg Oral Given 10/13/19 1214)  iohexol (OMNIPAQUE) 300 MG/ML solution 100 mL (100 mLs Intravenous Contrast Given 10/13/19 1231)     Initial Impression / Assessment and Plan / ED Course  I have reviewed the triage vital signs and the nursing notes.  Pertinent labs & imaging results that were available during my care of the patient were reviewed by me and considered in my medical decision making (see chart for details).        BP 95/60 (BP Location: Right Arm)    Pulse (!) 105    Temp (!) 103 F (39.4 C) (Oral)    Resp 18    SpO2 100%    Final Clinical Impressions(s) / ED Diagnoses   Final diagnoses:  Sepsis, due to unspecified organism, unspecified whether acute organ dysfunction present (HCC)  Suspected COVID-19 virus infection  Hb-SS disease with acute chest syndrome [facility](HCC)    ED Discharge Orders    None     10:07 AM Patient planing of fever chills cough and diffuse abdominal discomfort.  He also endorsed sore throat.  Suspect potential COVID-19.  Work-up initiated.  Does have mild diffuse abdominal tenderness without any peritoneal sign.  11:55 AM Labs remarkable for hemoglobin of 5.2.  No prior values for comparison however patient does have a history of sickle cell anemia therefore I suspect this is likely to be sickle cell related.  This is microcytic anemia with MCV of 66.4.  He has depressed white count of 2.7 however platelet is normal.  Normal electrolytes panel.  Normal lipase.  Chest x-ray unremarkable.  Due to potential acute chest in the setting of cough, upper abdominal pain, febrile, tachycardia, will initiate antibiotic which includes Rocephin and azithromycin.   Patient will also benefit from blood transfusion.  12:16 PM I discussed patient's hemoglobin level and recommend blood transfusion.  Patient states he cannot consent for blood transfusion due to his religious belief.  He does report noticing blood in his stools when having bowel movement for the past year.  He denies and any significant rectal pain.  On rectal examination, no obvious thrombosed hemorrhoid, no obvious blood on glove and no signs of stool impaction.  2:14 PM Appreciate consultation from [facility] Dr. Sharolyn DouglasEzenduka who  agrees to see and admit pt.  She request LFT to be ordered.  Pt may benefit iron transfusion.   2:33 PM Pt report he was diagnosed with sickle cell disease at a young age.  He has not had a PCP for more than 10 years.  He does report recurrent pain with weather changes. Does not have anyone to care for his sickle cell disease.  He report Christianity is his religion and it not Jehovah Witness but does not want blood transfusion due to his belief that receiving blood from another person would constitute sharing their soul.  I encourage patient to consider blood transfusion as it may improve his condition.  Pt will reconsider.   4:40 PM Dr. Sharolyn Douglas have requested sickle cell team to admit pt.  Dr. Hyman Hopes will see and admit pt for further care.    Fayrene Helper, PA-C 10/13/19 1419    Fayrene Helper, PA-C 10/13/19 1435    Fayrene Helper, PA-C 10/13/19 1640    Linwood Dibbles, MD 10/14/19 [phone]

## 2019-10-13 NOTE — ED Notes (Signed)
Note: Pt. States it is "against my religion to get blood". [place], our P.A. speaks with him about this at some length, and he consistently tells [place] he refuses/declines transfusion. Therefore, no pink tube is drawn nor sent to lab. He tells me he feels much better. He is now pleasant and calm.

## 2019-10-13 NOTE — ED Notes (Signed)
Our hospitalist has just informed me that pt. Has consented to allow [place] to transfuse him. I will perform the necessary blood work shortly.

## 2019-10-13 NOTE — ED Notes (Signed)
Date and time results received: 10/13/19 11:50 AM  (use smartphrase ".now" to insert current time)  Test: Hgb Critical Value: 5.2  Name of Provider Notified: Joanne Chars.  Orders Received? Or Actions Taken?:

## 2019-10-13 NOTE — ED Triage Notes (Signed)
Per EMS- patient ws picked up from a laundromat. Patient was not cooperative. Patient would not answer questions. Patient c/o fever, chills, headache, N/V x 8 hours.

## 2019-10-13 NOTE — ED Notes (Signed)
I have just called report to [place], RN on [place]. Will transport shortly. As I write this, he is eating supper.

## 2019-10-13 NOTE — ED Notes (Signed)
[place], our P.A. performed rectal exam, however, no stool was obtained to perform occult blood testing.

## 2019-10-13 NOTE — ED Notes (Signed)
Date and time results received: 10/13/19 11:58 AM  (use smartphrase ".now" to insert current time)  Test: Lactic acid Critical Value: 2.6  Name of Provider Notified: PA Gertie Fey  Orders Received? Or Actions Taken?:

## 2019-10-13 NOTE — ED Notes (Signed)
ED TO INPATIENT HANDOFF REPORT  Name/Age/Gender Alfred Phillips 33 y.o. male  Code Status   Home/SNF/Other Home  Chief Complaint possible covid-19  Level of Care/Admitting Diagnosis ED Disposition    ED Disposition Condition [facility]: [place] [[id number]]  Level of Care: Med-Surg [16]  Covid Evaluation: Confirmed COVID Negative  Diagnosis: Symptomatic anemia [[id number]]  Admitting Physician: Tresa Garter [[id number]]  Attending Physician: Tresa Garter [[id number]]  Estimated length of stay: past midnight tomorrow  Certification:: I certify this patient will need inpatient services for at least 2 midnights  Bed request comments: 6E  PT Class (Do Not Modify): Inpatient [101]  PT Acc Code (Do Not Modify): Private [1]       Medical History Past Medical History:  Diagnosis Date  . Osteomyelitis ([place])   . Sickle cell anemia ([facility])     Allergies No Known Allergies  IV Location/Drains/Wounds Patient Lines/Drains/Airways Status   Active Line/Drains/Airways    Name:   Placement date:   Placement time:   Site:   Days:   Peripheral IV 10/13/19 Left;Lateral Forearm   10/13/19    1047    Forearm   less than 1          Labs/Imaging Results for orders placed or performed during the hospital encounter of 10/13/19 (from the past 48 hour(s))  Urinalysis, Routine w reflex microscopic     Status: None   Collection Time: 10/13/19 10:04 AM  Result Value Ref Range   Color, Urine YELLOW YELLOW   APPearance CLEAR CLEAR   Specific Gravity, Urine 1.013 1.005 - 1.030   pH 6.0 5.0 - 8.0   Glucose, UA NEGATIVE NEGATIVE mg/dL   Hgb urine dipstick NEGATIVE NEGATIVE   Bilirubin Urine NEGATIVE NEGATIVE   Ketones, ur NEGATIVE NEGATIVE mg/dL   Protein, ur NEGATIVE NEGATIVE mg/dL   Nitrite NEGATIVE NEGATIVE   Leukocytes,Ua NEGATIVE NEGATIVE    Comment: Performed at [facility], [place] [address] [place], [place] [address]   SARS CORONAVIRUS 2 (TAT 6-24 HRS) Nasopharyngeal Nasopharyngeal Swab     Status: None   Collection Time: 10/13/19 10:46 AM   Specimen: Nasopharyngeal Swab  Result Value Ref Range   SARS Coronavirus 2 NEGATIVE NEGATIVE    Comment: (NOTE) SARS-CoV-2 target nucleic acids are NOT DETECTED. The SARS-CoV-2 RNA is generally detectable in upper and lower respiratory specimens during the acute phase of infection. Negative results do not preclude SARS-CoV-2 infection, do not rule out co-infections with other pathogens, and should not be used as the sole basis for treatment or other patient management decisions. Negative results must be combined with clinical observations, patient history, and epidemiological information. The expected result is Negative. Fact Sheet for Patients: SugarRoll.be Fact Sheet for Healthcare Providers: https://www.woods-mathews.com/ This test is not yet approved or cleared by the [place] FDA and  has been authorized for detection and/or diagnosis of SARS-CoV-2 by FDA under an Emergency Use Authorization (EUA). This EUA will remain  in effect (meaning this test can be used) for the duration of the COVID-19 declaration under Section 56 4(b)(1) of the Act, 21 U.S.C. section 360bbb-3(b)(1), unless the authorization is terminated or revoked sooner. Performed at [facility], [place] [address], [place], [place] [address]   CBC with Differential/Platelet     Status: Abnormal   Collection Time: 10/13/19 10:46 AM  Result Value Ref Range   WBC 2.7 (L) 4.0 - 10.5 K/uL   RBC 3.04 (  L) 4.22 - 5.81 MIL/uL   Hemoglobin 5.2 (LL) 13.0 - 17.0 g/dL    Comment: REPEATED TO VERIFY Reticulocyte Hemoglobin testing may be clinically indicated, consider ordering this additional test [id number] THIS CRITICAL RESULT HAS VERIFIED AND BEEN CALLED TO Annasophia Crocker,T RN BY MARINDA BLACK ON 10 18 2020 AT 1150, AND HAS BEEN READ BACK. CRITICAL HGB     HCT 20.2 (L) 39.0 - 52.0 %   MCV 66.4 (L) 80.0 - 100.0 fL   MCH 17.1 (L) 26.0 - 34.0 pg   MCHC 25.7 (L) 30.0 - 36.0 g/dL   RDW 62.122.5 (H) 30.811.5 - 65.715.5 %   Platelets 247 150 - 400 K/uL   nRBC 4.1 (H) 0.0 - 0.2 %   Neutrophils Relative % 94 %   Neutro Abs 2.5 1.7 - 7.7 K/uL   Lymphocytes Relative 5 %   Lymphs Abs 0.1 (L) 0.7 - 4.0 K/uL   Monocytes Relative 1 %   Monocytes Absolute 0.0 (L) 0.1 - 1.0 K/uL   Eosinophils Relative 0 %   Eosinophils Absolute 0.0 0.0 - 0.5 K/uL   Basophils Relative 0 %   Basophils Absolute 0.0 0.0 - 0.1 K/uL   Immature Granulocytes 0 %   Abs Immature Granulocytes 0.01 0.00 - 0.07 K/uL   Polychromasia PRESENT    Stomatocytes PRESENT     Comment: Performed at [facility], [address] [place], [place] [address]  Basic metabolic panel     Status: Abnormal   Collection Time: 10/13/19 10:46 AM  Result Value Ref Range   Sodium 139 135 - 145 mmol/L   Potassium 4.0 3.5 - 5.1 mmol/L   Chloride 107 98 - 111 mmol/L   CO2 21 (L) 22 - 32 mmol/L   Glucose, Bld 74 70 - 99 mg/dL   BUN 12 6 - 20 mg/dL   Creatinine, Ser 2.950.91 0.61 - 1.24 mg/dL   Calcium 7.8 (L) 8.9 - 10.3 mg/dL   GFR calc non Af Amer >60 >60 mL/min   GFR calc Af Amer >60 >60 mL/min   Anion gap 11 5 - 15    Comment: Performed at [facility], [address] [place], [place] [address]  Lipase, blood     Status: None   Collection Time: 10/13/19 10:46 AM  Result Value Ref Range   Lipase 39 11 - 51 U/L    Comment: Performed at [facility], [address] [place], [place] [address]  Lactic acid, plasma     Status: Abnormal   Collection Time: 10/13/19 10:46 AM  Result Value Ref Range   Lactic Acid, Venous 2.6 (HH) 0.5 - 1.9 mmol/L    Comment: CRITICAL RESULT CALLED TO, READ BACK BY AND VERIFIED WITH: T Kingston Guiles,RN 10/13/19 1158 RHOLMES Performed at [facility], [address] [place], [place] [address]   Reticulocytes      Status: Abnormal   Collection Time: 10/13/19 10:46 AM  Result Value Ref Range   Retic Ct Pct 0.7 0.4 - 3.1 %   RBC. 2.99 (L) 4.22 - 5.81 MIL/uL   Retic Count, Absolute 20.6 19.0 - 186.0 K/uL   Immature Retic Fract 18.0 (H) 2.3 - 15.9 %    Comment: Performed at [facility], [address] [place], [place] [address]  Lactic acid, plasma     Status: Abnormal   Collection Time: 10/13/19 12:20 PM  Result Value Ref Range   Lactic Acid, Venous 2.4 (HH) 0.5 -  1.9 mmol/L    Comment: CRITICAL VALUE NOTED.  VALUE IS CONSISTENT WITH PREVIOUSLY REPORTED AND CALLED VALUE. Performed at [facility], [address], [place], [place] [address]   POC occult blood, ED Provider will collect     Status: None   Collection Time: 10/13/19  2:05 PM  Result Value Ref Range   Fecal Occult Bld NEGATIVE NEGATIVE  Hepatic function panel     Status: Abnormal   Collection Time: 10/13/19  3:46 PM  Result Value Ref Range   Total Protein 6.4 (L) 6.5 - 8.1 g/dL   Albumin 3.4 (L) 3.5 - 5.0 g/dL   AST 79 (H) 15 - 41 U/L   ALT 20 0 - 44 U/L   Alkaline Phosphatase 45 38 - 126 U/L   Total Bilirubin 0.6 0.3 - 1.2 mg/dL   Bilirubin, Direct 0.1 0.0 - 0.2 mg/dL   Indirect Bilirubin 0.5 0.3 - 0.9 mg/dL    Comment: Performed at [facility], [address], [place], [place] [address]  Vitamin B12     Status: None   Collection Time: 10/13/19  3:46 PM  Result Value Ref Range   Vitamin B-12 338 180 - 914 pg/mL    Comment: (NOTE) This assay is not validated for testing neonatal or myeloproliferative syndrome specimens for Vitamin B12 levels. Performed at [facility], [address] [place], [place] [address]   Folate     Status: None   Collection Time: 10/13/19  3:46 PM  Result Value Ref Range   Folate 8.3 >5.9 ng/mL    Comment: Performed at [facility], [address] [place], [place] [address]  Iron and TIBC     Status:  Abnormal   Collection Time: 10/13/19  3:46 PM  Result Value Ref Range   Iron 11 (L) 45 - 182 ug/dL   TIBC 295 621 - 308 ug/dL   Saturation Ratios 3 (L) 17.9 - 39.5 %   UIBC 408 ug/dL    Comment: Performed at [facility], [address] [place], [place] [address]  Ferritin     Status: None   Collection Time: 10/13/19  3:46 PM  Result Value Ref Range   Ferritin 63 24 - 336 ng/mL    Comment: Performed at [facility], [address], [place], [place] [address]  Protime-INR     Status: Abnormal   Collection Time: 10/13/19  3:46 PM  Result Value Ref Range   Prothrombin Time 21.6 (H) 11.4 - 15.2 seconds   INR 1.9 (H) 0.8 - 1.2    Comment: (NOTE) INR goal varies based on device and disease states. Performed at [facility], [address] [place], [place] [address]   APTT     Status: None   Collection Time: 10/13/19  3:46 PM  Result Value Ref Range   aPTT 30 24 - 36 seconds    Comment: Performed at [facility], [address], [place], [place] [address]   Ct Abdomen Pelvis W Contrast  Result Date: 10/13/2019 CLINICAL DATA:  33 year old male with history of diffuse abdominal pain with nausea and vomiting. Fever to 103 degrees. Rectal bleeding. EXAM: CT ABDOMEN AND PELVIS WITH CONTRAST TECHNIQUE: Multidetector CT imaging of the abdomen and pelvis was performed using the standard protocol following bolus administration of intravenous contrast. CONTRAST:  OMNIPAQUE IOHEXOL 300 MG/ML  SOLN COMPARISON:  No priors. FINDINGS: Lower chest: Unremarkable. Hepatobiliary: No  suspicious cystic or solid hepatic lesions. No intra or extrahepatic biliary ductal dilatation. Gallbladder is normal in appearance. Pancreas: No pancreatic mass. No pancreatic ductal dilatation. No pancreatic or peripancreatic fluid collections or inflammatory changes. Spleen: Unremarkable. Adrenals/Urinary Tract: Bilateral kidneys and adrenal  glands are normal in appearance. No hydroureteronephrosis. Urinary bladder is normal in appearance. Stomach/Bowel: Normal appearance of the stomach. No pathologic dilatation of small bowel or colon. The appendix is not confidently identified and may be surgically absent. Regardless, there are no inflammatory changes noted adjacent to the cecum to suggest the presence of an acute appendicitis at this time. Prominent hemorrhoidal veins adjacent to the distal rectum. Vascular/Lymphatic: No significant atherosclerotic disease, aneurysm or dissection noted in the abdominal or pelvic vasculature. No lymphadenopathy noted in the abdomen or pelvis. Reproductive: Prostate gland and seminal vesicles are unremarkable in appearance. Other: No significant volume of ascites.  No pneumoperitoneum. Musculoskeletal: There are no aggressive appearing lytic or blastic lesions noted in the visualized portions of the skeleton. IMPRESSION: 1. No acute findings are noted to account for the patient's abdominal pain, nausea or vomiting. 2. There are prominent hemorrhoidal veins adjacent to the distal rectum, which may account for the patient's rectal bleeding. Electronically Signed   By: Trudie Reed M.D.   On: 10/13/2019 13:05   Dg Chest Portable 1 View  Result Date: 10/13/2019 CLINICAL DATA:  Fever, chills, headache, nausea/vomiting EXAM: PORTABLE CHEST 1 VIEW COMPARISON:  None. FINDINGS: Lungs are clear.  No pleural effusion or pneumothorax. The heart is normal in size. IMPRESSION: No evidence of acute cardiopulmonary disease. Electronically Signed   By: Charline Bills M.D.   On: 10/13/2019 10:37    Pending Labs Unresulted Labs (From admission, onward)    Start     Ordered   10/13/19 1744  Prepare RBC  (Adult Blood Administration - Red Blood Cells)  Once,   R    Question Answer Comment  # of Units 2 units   Transfusion Indications Sickle cell crisis   If emergent release call blood bank Not emergent release    Instructions: Transfuse      10/13/19 1743   10/13/19 1731  Type and screen [facility]  Once,   STAT    Comments: [facility]    10/13/19 1730   10/13/19 1656  Hemoglobinopathy evaluation  Once,   STAT     10/13/19 1655   10/13/19 1617  Culture, blood (routine x 2)  BLOOD CULTURE X 2,   R (with STAT occurrences)     10/13/19 1616   Signed and Held  HIV Antibody (routine testing w rflx)  (HIV Antibody (Routine testing w reflex) panel)  Once,   R     Signed and Held   Signed and Held  CBC with Differential/Platelet  Tomorrow morning,   R     Signed and Held   Signed and Held  Urinalysis, dipstick only  Once,   R     Signed and Held   Medical illustrator and Held  Sputum culture  Once,   R     Signed and Held   Signed and Held  Type and screen [facility]  Once,   R    Comments: [facility]    Signed and Held          Vitals/Pain Today's Vitals   10/13/19 1301 10/13/19 1544 10/13/19 1545 10/13/19 1735  BP: 95/60 118/81  96/63  Pulse: (!) 105  93  87  Resp: 18 18  18   Temp:   99.4 F (37.4 C)   TempSrc:   Oral   SpO2: 100% 100%  100%  PainSc:   0-No pain     Isolation Precautions No active isolations  Medications Medications  0.9 %  sodium chloride infusion (Manually program via Guardrails IV Fluids) (has no administration in time range)  sodium chloride (PF) 0.9 % injection (has no administration in time range)  morphine 4 MG/ML injection 4 mg (0 mg Intravenous Hold 10/13/19 1556)  sodium chloride 0.9 % bolus 1,000 mL (0 mLs Intravenous Stopped 10/13/19 1155)  acetaminophen (TYLENOL) tablet 650 mg (650 mg Oral Given 10/13/19 1048)  ondansetron (ZOFRAN) injection 4 mg (4 mg Intravenous Given 10/13/19 1054)  lidocaine (XYLOCAINE) 2 % viscous mouth solution 15 mL (15 mLs Mouth/Throat Given 10/13/19 1048)  cefTRIAXone (ROCEPHIN) 1 g in sodium chloride 0.9 % 100 mL IVPB (0 g Intravenous Stopped 10/13/19  1307)  azithromycin (ZITHROMAX) tablet 500 mg (500 mg Oral Given 10/13/19 1214)  iohexol (OMNIPAQUE) 300 MG/ML solution 100 mL (100 mLs Intravenous Contrast Given 10/13/19 1231)    Mobility walks

## 2019-10-14 DIAGNOSIS — Z5900 Homelessness unspecified: Secondary | ICD-10-CM

## 2019-10-14 DIAGNOSIS — D72819 Decreased white blood cell count, unspecified: Secondary | ICD-10-CM

## 2019-10-14 LAB — CBC WITH DIFFERENTIAL/PLATELET
Abs Immature Granulocytes: 0.05 10*3/uL (ref 0.00–0.07)
Basophils Absolute: 0 10*3/uL (ref 0.0–0.1)
Basophils Relative: 0 %
Eosinophils Absolute: 0.2 10*3/uL (ref 0.0–0.5)
Eosinophils Relative: 2 %
HCT: 24 % — ABNORMAL LOW (ref 39.0–52.0)
Hemoglobin: 6.6 g/dL — CL (ref 13.0–17.0)
Immature Granulocytes: 1 %
Lymphocytes Relative: 12 %
Lymphs Abs: 1.1 10*3/uL (ref 0.7–4.0)
MCH: 20.1 pg — ABNORMAL LOW (ref 26.0–34.0)
MCHC: 27.5 g/dL — ABNORMAL LOW (ref 30.0–36.0)
MCV: 72.9 fL — ABNORMAL LOW (ref 80.0–100.0)
Monocytes Absolute: 0.3 10*3/uL (ref 0.1–1.0)
Monocytes Relative: 4 %
Neutro Abs: 7.3 10*3/uL (ref 1.7–7.7)
Neutrophils Relative %: 81 %
Platelets: 186 10*3/uL (ref 150–400)
RBC: 3.29 MIL/uL — ABNORMAL LOW (ref 4.22–5.81)
RDW: 24.8 % — ABNORMAL HIGH (ref 11.5–15.5)
WBC: 8.9 10*3/uL (ref 4.0–10.5)
nRBC: 1.1 % — ABNORMAL HIGH (ref 0.0–0.2)

## 2019-10-14 LAB — HIV ANTIBODY (ROUTINE TESTING W REFLEX): HIV Screen 4th Generation wRfx: NONREACTIVE

## 2019-10-14 MED ORDER — ACETAMINOPHEN 325 MG PO TABS
650.0000 mg | ORAL_TABLET | Freq: Four times a day (QID) | ORAL | Status: DC | PRN
  Administered 2019-10-16: 02:00:00 650 mg via ORAL
  Filled 2019-10-14: qty 2

## 2019-10-14 NOTE — Progress Notes (Signed)
Subjective: Alfred Phillips, a 33 year old male with a possible history of sickle cell disease was admitted with symptomatic anemia.    Hemoglobin was 5.2 on admission.  Patient was transfused 1 unit of PRBCs.  Hemoglobin increased to 6.6.  Unsure of patient's baseline at this juncture.  Patient has been afebrile overnight.  Antibiotics have been continued.  Patient endorses tooth pain overnight.  He says that he has not been to a dentist.  He characterizes tooth pain as intermittent and throbbing.  Also, patient endorses left neck pain.  Pain intensity 2/10.   Objective:  Vital signs in last 24 hours:  Vitals:   10/14/19 0041 10/14/19 0235 10/14/19 0429 10/14/19 1004  BP: 115/78 119/87 116/88 128/86  Pulse: 70 65 65 72  Resp: 18 16 18    Temp: 98.1 F (36.7 C) 97.8 F (36.6 C) 98.1 F (36.7 C) 98.4 F (36.9 C)  TempSrc: Oral Oral Oral Oral  SpO2: 100% 100% 100% 99%    Intake/Output from previous day:   Intake/Output Summary (Last 24 hours) at 10/14/2019 1030 Last data filed at 10/14/2019 0800 Gross per 24 hour  Intake 3145.76 ml  Output -  Net 3145.76 ml    Physical Exam: General: Alert, awake, oriented x3, in no acute distress.  HEENT: [place]/AT PEERL, EOMI Neck: Trachea midline,  no masses, no thyromegal,y no JVD, no carotid bruit OROPHARYNX:  Moist, No exudate/ erythema/lesions.  Heart: Regular rate and rhythm, without murmurs, rubs, gallops, PMI non-displaced, no heaves or thrills on palpation.  Lungs: Clear to auscultation, no wheezing or rhonchi noted. No increased vocal fremitus resonant to percussion  Abdomen: Soft, nontender, nondistended, positive bowel sounds, no masses no hepatosplenomegaly noted..  Neuro: No focal neurological deficits noted cranial nerves II through XII grossly intact. DTRs 2+ bilaterally upper and lower extremities. Strength 5 out of 5 in bilateral upper and lower extremities. Musculoskeletal: No warm swelling or erythema around joints, no spinal  tenderness noted. Psychiatric: Patient alert and oriented x3, good insight and cognition, good recent to remote recall. Lymph node survey: No cervical axillary or inguinal lymphadenopathy noted.  Lab Results:  Basic Metabolic Panel:    Component Value Date/Time   NA 139 10/13/2019 1046   K 4.0 10/13/2019 1046   CL 107 10/13/2019 1046   CO2 21 (L) 10/13/2019 1046   BUN 12 10/13/2019 1046   CREATININE 0.91 10/13/2019 1046   GLUCOSE 74 10/13/2019 1046   CALCIUM 7.8 (L) 10/13/2019 1046   CBC:    Component Value Date/Time   WBC 8.9 10/14/2019 0458   HGB 6.6 (LL) 10/14/2019 0458   HCT 24.0 (L) 10/14/2019 0458   PLT 186 10/14/2019 0458   MCV 72.9 (L) 10/14/2019 0458   NEUTROABS 7.3 10/14/2019 0458   LYMPHSABS 1.1 10/14/2019 0458   MONOABS 0.3 10/14/2019 0458   EOSABS 0.2 10/14/2019 0458   BASOSABS 0.0 10/14/2019 0458    Recent Results (from the past 240 hour(s))  SARS CORONAVIRUS 2 (TAT 6-24 HRS) Nasopharyngeal Nasopharyngeal Swab     Status: None   Collection Time: 10/13/19 10:46 AM   Specimen: Nasopharyngeal Swab  Result Value Ref Range Status   SARS Coronavirus 2 NEGATIVE NEGATIVE Final    Comment: (NOTE) SARS-CoV-2 target nucleic acids are NOT DETECTED. The SARS-CoV-2 RNA is generally detectable in upper and lower respiratory specimens during the acute phase of infection. Negative results do not preclude SARS-CoV-2 infection, do not rule out co-infections with other pathogens, and should not be used as the sole  basis for treatment or other patient management decisions. Negative results must be combined with clinical observations, patient history, and epidemiological information. The expected result is Negative. Fact Sheet for Patients: SugarRoll.be Fact Sheet for Healthcare Providers: https://www.woods-mathews.com/ This test is not yet approved or cleared by the [place] FDA and  has been authorized for detection and/or  diagnosis of SARS-CoV-2 by FDA under an Emergency Use Authorization (EUA). This EUA will remain  in effect (meaning this test can be used) for the duration of the COVID-19 declaration under Section 56 4(b)(1) of the Act, 21 U.S.C. section 360bbb-3(b)(1), unless the authorization is terminated or revoked sooner. Performed at [facility], [place] [address], [place], [place] [address]   Culture, blood (routine x 2)     Status: None (Preliminary result)   Collection Time: 10/13/19  4:52 PM   Specimen: BLOOD  Result Value Ref Range Status   Specimen Description   Final    BLOOD RIGHT ANTECUBITAL Performed at [place] [facility] [address], [place], [place] [address]    Special Requests   Final    BOTTLES DRAWN AEROBIC AND ANAEROBIC Blood Culture results may not be optimal due to an excessive volume of blood received in culture bottles Performed at [place] [address] [place], [place] [address]    Culture   Final    NO GROWTH < 12 HOURS Performed at [place] [address], [place], [place] [address]    Report Status PENDING  Incomplete  Culture, blood (routine x 2)     Status: None (Preliminary result)   Collection Time: 10/13/19  4:53 PM   Specimen: BLOOD RIGHT FOREARM  Result Value Ref Range Status   Specimen Description   Final    BLOOD RIGHT FOREARM Performed at [place] [address] [place], [place] [address]    Special Requests   Final    BOTTLES DRAWN AEROBIC AND ANAEROBIC Blood Culture adequate volume Performed at [facility] [address], [place], [place] [address]    Culture   Final    NO GROWTH < 12 HOURS Performed at [facility] [address], [place],  [address]    Report Status PENDING  Incomplete    Studies/Results: Ct Abdomen Pelvis W Contrast  Result Date: 10/13/2019 CLINICAL DATA:  33 year old male with history of diffuse  abdominal pain with nausea and vomiting. Fever to 103 degrees. Rectal bleeding. EXAM: CT ABDOMEN AND PELVIS WITH CONTRAST TECHNIQUE: Multidetector CT imaging of the abdomen and pelvis was performed using the standard protocol following bolus administration of intravenous contrast. CONTRAST:  130mL OMNIPAQUE IOHEXOL 300 MG/ML  SOLN COMPARISON:  No priors. FINDINGS: Lower chest: Unremarkable. Hepatobiliary: No suspicious cystic or solid hepatic lesions. No intra or extrahepatic biliary ductal dilatation. Gallbladder is normal in appearance. Pancreas: No pancreatic mass. No pancreatic ductal dilatation. No pancreatic or peripancreatic fluid collections or inflammatory changes. Spleen: Unremarkable. Adrenals/Urinary Tract: Bilateral kidneys and adrenal glands are normal in appearance. No hydroureteronephrosis. Urinary bladder is normal in appearance. Stomach/Bowel: Normal appearance of the stomach. No pathologic dilatation of small bowel or colon. The appendix is not confidently identified and may be surgically absent. Regardless, there are no inflammatory changes noted adjacent to the cecum to suggest the presence of an acute appendicitis at this time. Prominent hemorrhoidal veins adjacent to the distal rectum. Vascular/Lymphatic: No significant atherosclerotic disease, aneurysm or dissection noted in the abdominal or pelvic  vasculature. No lymphadenopathy noted in the abdomen or pelvis. Reproductive: Prostate gland and seminal vesicles are unremarkable in appearance. Other: No significant volume of ascites.  No pneumoperitoneum. Musculoskeletal: There are no aggressive appearing lytic or blastic lesions noted in the visualized portions of the skeleton. IMPRESSION: 1. No acute findings are noted to account for the patient's abdominal pain, nausea or vomiting. 2. There are prominent hemorrhoidal veins adjacent to the distal rectum, which may account for the patient's rectal bleeding. Electronically Signed   By: Trudie Reedaniel   Entrikin M.D.   On: 10/13/2019 13:05   Dg Chest Portable 1 View  Result Date: 10/13/2019 CLINICAL DATA:  Fever, chills, headache, nausea/vomiting EXAM: PORTABLE CHEST 1 VIEW COMPARISON:  None. FINDINGS: Lungs are clear.  No pleural effusion or pneumothorax. The heart is normal in size. IMPRESSION: No evidence of acute cardiopulmonary disease. Electronically Signed   By: Charline BillsSriyesh  Krishnan M.D.   On: 10/13/2019 10:37    Medications: Scheduled Meds: . azithromycin  500 mg Oral Daily  . enoxaparin (LOVENOX) injection  40 mg Subcutaneous Q24H  . folic acid  1 mg Oral Daily  . ketorolac  15 mg Intravenous Q6H  .  morphine injection  4 mg Intravenous Once  . senna-docusate  1 tablet Oral BID   Continuous Infusions: . sodium chloride 75 mL/hr at 10/14/19 0613  . cefTRIAXone (ROCEPHIN)  IV     PRN Meds:.benzocaine, polyethylene glycol, promethazine **OR** promethazine  Consultants:  None  Procedures:  None  Antibiotics:  IV Rocephin, azithromycin by mouth  Assessment/Plan: Active Problems:   Sepsis (HCC)   Symptomatic anemia  Symptomatic anemia: Hemoglobin improved from 5.2-6.6.  Patient s/p 1 unit of PRBCs.  Repeat CBC in a.m.  If hemoglobin less than 6, transfuse additional unit of PRBCs  Probable sickle cell anemia: Awaiting hemoglobinopathy results.  Patient states that when he was younger in [place], he was hospitalized several times.  During those times, he received blood transfusions.  Patient is a poor historian and does not remember whether he had sickle cell trait or actual sickle cell disease. We will continue IV Toradol 15 mg every 6 hours 650 mg every 6 hours as needed Continue IV fluids, 0.45% saline at 75 mL/h.  Fever: Patient has been afebrile overnight.  Continue antibiotics.  COVID-19 negative, urinalysis unremarkable, lactic acid negative.  Continue to monitor vital signs closely  Homelessness: Patient states that he is been mostly living with  different friends.  He does not have a permanent address.  Social work consult.  Also, patient connected with [facility] for community resources.  Leukopenia: On admission, WBCs 2.7.  Improved to 8.9.  HIV nonreactive. Continue to monitor closely.  Code Status: Full Code Family Communication: N/A Disposition Plan: Not yet ready for discharge  Leam Madero Rennis PettyMoore Shaheer Bonfield  APRN, MSN, FNP-C Patient Care Center [facility] [address]  [place], [place] [address] [phone]  If 5PM-7AM, please contact night-coverage.  10/14/2019, 10:30 AM  LOS: 1 day    This note was prepared using Dragon speech recognition software, errors in dictation are unintentional.

## 2019-10-15 ENCOUNTER — Encounter (HOSPITAL_COMMUNITY): Payer: Self-pay

## 2019-10-15 LAB — HEMOGLOBINOPATHY EVALUATION
Hgb A2 Quant: 2.8 % (ref 1.8–3.2)
Hgb A: 67.7 % — ABNORMAL LOW (ref 96.4–98.8)
Hgb C: 0 %
Hgb F Quant: 0 % (ref 0.0–2.0)
Hgb S Quant: 29.5 % — ABNORMAL HIGH
Hgb Variant: 0 %

## 2019-10-15 LAB — PREPARE RBC (CROSSMATCH)

## 2019-10-15 LAB — CBC
HCT: 25.2 % — ABNORMAL LOW (ref 39.0–52.0)
Hemoglobin: 6.8 g/dL — CL (ref 13.0–17.0)
MCH: 19.7 pg — ABNORMAL LOW (ref 26.0–34.0)
MCHC: 27 g/dL — ABNORMAL LOW (ref 30.0–36.0)
MCV: 73 fL — ABNORMAL LOW (ref 80.0–100.0)
Platelets: 147 10*3/uL — ABNORMAL LOW (ref 150–400)
RBC: 3.45 MIL/uL — ABNORMAL LOW (ref 4.22–5.81)
RDW: 24.9 % — ABNORMAL HIGH (ref 11.5–15.5)
WBC: 5.5 10*3/uL (ref 4.0–10.5)
nRBC: 2.9 % — ABNORMAL HIGH (ref 0.0–0.2)

## 2019-10-15 LAB — HEMOGLOBIN AND HEMATOCRIT, BLOOD
HCT: 29.8 % — ABNORMAL LOW (ref 39.0–52.0)
Hemoglobin: 8.6 g/dL — ABNORMAL LOW (ref 13.0–17.0)

## 2019-10-15 MED ORDER — KETOROLAC TROMETHAMINE 30 MG/ML IJ SOLN
15.0000 mg | Freq: Four times a day (QID) | INTRAMUSCULAR | Status: DC
  Administered 2019-10-15 – 2019-10-16 (×4): 15 mg via INTRAVENOUS
  Filled 2019-10-15 (×4): qty 1

## 2019-10-15 NOTE — TOC Initial Note (Signed)
Transition of Care [facility]) - Initial/Assessment Note    Patient Details  Name: Alfred Phillips MRN: [id number] Date of Birth: June 21, 1986  Transition of Care [facility]) CM/SW Contact:    Joaquin Courts, RN Phone Number: 10/15/2019, 10:28 AM  Clinical Narrative:    CM spoke with patient at bedside. Patient reports he intends to set up primary care with the [facility], plans to schedule first appointment once discharged from the hospital. Patient also reports he is working with Public affairs consultant to apply for medicaid.                 Expected Discharge Plan: Home/Self Care Barriers to Discharge: Continued Medical Work up   Patient Goals and CMS Choice        Expected Discharge Plan and Services Expected Discharge Plan: Home/Self Care       Living arrangements for the past 2 months: Single Family Home                 DME Arranged: N/A DME Agency: NA       HH Arranged: NA [place] Agency: NA        Prior Living Arrangements/Services Living arrangements for the past 2 months: Single Family Home Lives with:: Friends Patient language and need for interpreter reviewed:: Yes Do you feel safe going back to the place where you live?: Yes      Need for Family Participation in Patient Care: No (Comment) Care giver support system in place?: No (comment)   Criminal Activity/Legal Involvement Pertinent to Current Situation/Hospitalization: No - Comment as needed  Activities of Daily Living Home Assistive Devices/Equipment: None ADL Screening (condition at time of admission) Patient's cognitive ability adequate to safely complete daily activities?: Yes Is the patient deaf or have difficulty hearing?: No Does the patient have difficulty seeing, even when wearing glasses/contacts?: No Does the patient have difficulty concentrating, remembering, or making decisions?: No Patient able to express need for assistance with ADLs?: Yes Does the patient have difficulty dressing  or bathing?: No Independently performs ADLs?: Yes (appropriate for developmental age) Walks in Home: Independent Does the patient have difficulty walking or climbing stairs?: No Weakness of Legs: None Weakness of Arms/Hands: None  Permission Sought/Granted                  Emotional Assessment Appearance:: Appears stated age Attitude/Demeanor/Rapport: Engaged   Orientation: : Oriented to Place, Oriented to  Time, Oriented to Situation, Oriented to Self   Psych Involvement: No (comment)  Admission diagnosis:  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 Leukopenia 10/14/2019  . Homelessness 10/14/2019  . Sepsis ([place]) 10/13/2019  . Symptomatic anemia 10/13/2019  . Hb-SS disease with acute chest syndrome ([place])   . Suspected COVID-19 virus infection    PCP:  Patient, No Pcp Per Pharmacy:   [place], [place] [address] Phone: [phone] Fax: [phone]     Social Determinants of Health (SDOH) Interventions    Readmission Risk Interventions No flowsheet data found.

## 2019-10-15 NOTE — Progress Notes (Signed)
Subjective: Alfred Phillips, a 33 year old male with a possible history of sickle cell trait was admitted with symptomatic anemia and fever of unknown origin. Marland Kitchen Hemoglobin was 5.2 on admission.  At that time, patient was infused 1 unit of PRBCs.  Hemoglobin 6.8 on today.  Patient's baseline has not been determined.  Patient has been afebrile overnight.  Antibiotics continued.  Patient says that tooth pain is improved.  He is not having pain on today.  Objective:  Vital signs in last 24 hours:  Vitals:   10/14/19 1728 10/14/19 2227 10/15/19 0217 10/15/19 0556  BP: 121/85 (!) 130/92 120/87 (!) 124/95  Pulse: 77 66 61 66  Resp: 18 18 18 18   Temp: 98.8 F (37.1 C) 98.4 F (36.9 C) 98.2 F (36.8 C) 98 F (36.7 C)  TempSrc: Oral Oral Oral Oral  SpO2: 100% 99% 99% 98%    Intake/Output from previous day:   Intake/Output Summary (Last 24 hours) at 10/15/2019 0846 Last data filed at 10/14/2019 1840 Gross per 24 hour  Intake 1226.55 ml  Output -  Net 1226.55 ml    Physical Exam: General: Alert, awake, oriented x3, in no acute distress.  HEENT: [place]/AT PEERL, EOMI Neck: Trachea midline,  no masses, no thyromegal,y no JVD, no carotid bruit OROPHARYNX:  Moist, No exudate/ erythema/lesions.  Heart: Regular rate and rhythm, without murmurs, rubs, gallops, PMI non-displaced, no heaves or thrills on palpation.  Lungs: Clear to auscultation, no wheezing or rhonchi noted. No increased vocal fremitus resonant to percussion  Abdomen: Soft, nontender, nondistended, positive bowel sounds, no masses no hepatosplenomegaly noted..  Neuro: No focal neurological deficits noted cranial nerves II through XII grossly intact. DTRs 2+ bilaterally upper and lower extremities. Strength 5 out of 5 in bilateral upper and lower extremities. Musculoskeletal: No warm swelling or erythema around joints, no spinal tenderness noted. Psychiatric: Patient alert and oriented x3, good insight and cognition, good recent to  remote recall. Lymph node survey: No cervical axillary or inguinal lymphadenopathy noted.  Lab Results:  Basic Metabolic Panel:    Component Value Date/Time   NA 139 10/13/2019 1046   K 4.0 10/13/2019 1046   CL 107 10/13/2019 1046   CO2 21 (L) 10/13/2019 1046   BUN 12 10/13/2019 1046   CREATININE 0.91 10/13/2019 1046   GLUCOSE 74 10/13/2019 1046   CALCIUM 7.8 (L) 10/13/2019 1046   CBC:    Component Value Date/Time   WBC 5.5 10/15/2019 0507   HGB 6.8 (LL) 10/15/2019 0507   HCT 25.2 (L) 10/15/2019 0507   PLT 147 (L) 10/15/2019 0507   MCV 73.0 (L) 10/15/2019 0507   NEUTROABS 7.3 10/14/2019 0458   LYMPHSABS 1.1 10/14/2019 0458   MONOABS 0.3 10/14/2019 0458   EOSABS 0.2 10/14/2019 0458   BASOSABS 0.0 10/14/2019 0458    Recent Results (from the past 240 hour(s))  SARS CORONAVIRUS 2 (TAT 6-24 HRS) Nasopharyngeal Nasopharyngeal Swab     Status: None   Collection Time: 10/13/19 10:46 AM   Specimen: Nasopharyngeal Swab  Result Value Ref Range Status   SARS Coronavirus 2 NEGATIVE NEGATIVE Final    Comment: (NOTE) SARS-CoV-2 target nucleic acids are NOT DETECTED. The SARS-CoV-2 RNA is generally detectable in upper and lower respiratory specimens during the acute phase of infection. Negative results do not preclude SARS-CoV-2 infection, do not rule out co-infections with other pathogens, and should not be used as the sole basis for treatment or other patient management decisions. Negative results must be combined with clinical observations,  patient history, and epidemiological information. The expected result is Negative. Fact Sheet for Patients: HairSlick.nohttps://www.fda.gov/media/138098/download Fact Sheet for Healthcare Providers: quierodirigir.comhttps://www.fda.gov/media/138095/download This test is not yet approved or cleared by the [place] FDA and  has been authorized for detection and/or diagnosis of SARS-CoV-2 by FDA under an Emergency Use Authorization (EUA). This EUA will remain  in  effect (meaning this test can be used) for the duration of the COVID-19 declaration under Section 56 4(b)(1) of the Act, 21 U.S.C. section 360bbb-3(b)(1), unless the authorization is terminated or revoked sooner. Performed at [facility], [address] [place], [place] [address]   Culture, blood (routine x 2)     Status: None (Preliminary result)   Collection Time: 10/13/19  4:52 PM   Specimen: BLOOD  Result Value Ref Range Status   Specimen Description   Final    BLOOD RIGHT ANTECUBITAL Performed at [facility], [address] [place], [place] [address]    Special Requests   Final    BOTTLES DRAWN AEROBIC AND ANAEROBIC Blood Culture results may not be optimal due to an excessive volume of blood received in culture bottles Performed at [facility], [address] [place], [place] [address]    Culture   Final    NO GROWTH 2 DAYS Performed at [facility], [address] [place], [place] [address]    Report Status PENDING  Incomplete  Culture, blood (routine x 2)     Status: None (Preliminary result)   Collection Time: 10/13/19  4:53 PM   Specimen: BLOOD RIGHT FOREARM  Result Value Ref Range Status   Specimen Description   Final    BLOOD RIGHT FOREARM Performed at [facility], [address] [place], [place] [address]    Special Requests   Final    BOTTLES DRAWN AEROBIC AND ANAEROBIC Blood Culture adequate volume Performed at [facility], [address] [place], [place] [address]    Culture   Final    NO GROWTH 2 DAYS Performed at [facility], [address] [place], [place] [address]    Report Status PENDING  Incomplete    Studies/Results: Ct Abdomen Pelvis W Contrast  Result Date: 10/13/2019 CLINICAL DATA:  33 year old male with history of diffuse abdominal pain with nausea and vomiting. Fever to 103 degrees. Rectal bleeding. EXAM: CT ABDOMEN AND PELVIS WITH  CONTRAST TECHNIQUE: Multidetector CT imaging of the abdomen and pelvis was performed using the standard protocol following bolus administration of intravenous contrast. CONTRAST:  100mL OMNIPAQUE IOHEXOL 300 MG/ML  SOLN COMPARISON:  No priors. FINDINGS: Lower chest: Unremarkable. Hepatobiliary: No suspicious cystic or solid hepatic lesions. No intra or extrahepatic biliary ductal dilatation. Gallbladder is normal in appearance. Pancreas: No pancreatic mass. No pancreatic ductal dilatation. No pancreatic or peripancreatic fluid collections or inflammatory changes. Spleen: Unremarkable. Adrenals/Urinary Tract: Bilateral kidneys and adrenal glands are normal in appearance. No hydroureteronephrosis. Urinary bladder is normal in appearance. Stomach/Bowel: Normal appearance of the stomach. No pathologic dilatation of small bowel or colon. The appendix is not confidently identified and may be surgically absent. Regardless, there are no inflammatory changes noted adjacent to the cecum to suggest the presence of an acute appendicitis at this time. Prominent hemorrhoidal veins adjacent to the distal rectum. Vascular/Lymphatic: No significant atherosclerotic disease, aneurysm or dissection noted in the abdominal or pelvic vasculature. No lymphadenopathy noted in the abdomen or pelvis. Reproductive: Prostate gland and seminal vesicles are unremarkable in  appearance. Other: No significant volume of ascites.  No pneumoperitoneum. Musculoskeletal: There are no aggressive appearing lytic or blastic lesions noted in the visualized portions of the skeleton. IMPRESSION: 1. No acute findings are noted to account for the patient's abdominal pain, nausea or vomiting. 2. There are prominent hemorrhoidal veins adjacent to the distal rectum, which may account for the patient's rectal bleeding. Electronically Signed   By: Trudie Reed M.D.   On: 10/13/2019 13:05   Dg Chest Portable 1 View  Result Date: 10/13/2019 CLINICAL DATA:   Fever, chills, headache, nausea/vomiting EXAM: PORTABLE CHEST 1 VIEW COMPARISON:  None. FINDINGS: Lungs are clear.  No pleural effusion or pneumothorax. The heart is normal in size. IMPRESSION: No evidence of acute cardiopulmonary disease. Electronically Signed   By: Charline Bills M.D.   On: 10/13/2019 10:37    Medications: Scheduled Meds: . azithromycin  500 mg Oral Daily  . enoxaparin (LOVENOX) injection  40 mg Subcutaneous Q24H  . folic acid  1 mg Oral Daily  . ketorolac  15 mg Intravenous Q6H  .  morphine injection  4 mg Intravenous Once  . senna-docusate  1 tablet Oral BID   Continuous Infusions: . cefTRIAXone (ROCEPHIN)  IV Stopped (10/14/19 1214)   PRN Meds:.acetaminophen, benzocaine, polyethylene glycol, promethazine **OR** promethazine  Consultants:  None  Procedures:  Transfuse 1 unit PRBCs  Antibiotics:  IV Rocephin, azithromycin by mouth  Assessment/Plan: Active Problems:   Sepsis (HCC)   Symptomatic anemia   Leukopenia   Homelessness  Symptomatic anemia: Hemoglobin 6.8 on today.  Transfuse an additional unit of PRBCs.  Repeat CBC in a.m.  Sickle cell trait: Hemoglobinopathy shows the patient has sickle cell trait. Discontinue IV Toradol Discontinue IV fluids  Fever of unknown origin: Patient has been afebrile overnight.  COVID-19 negative.  Urinalysis unremarkable and lactic acid negative. Continue to monitor closely.  The patient remains afebrile, discontinue antibiotics in a.m.  Homelessness: Patient states that he has mostly been residing with friends.  He does not have a permanent address.  Patient connected with [facility].  LCSW consult  Leukopenia: Resolved.  Continue to monitor closely.  Code Status: Full Code Family Communication: N/A Disposition Plan: Not yet ready for discharge  Dmario Russom Rennis Petty  APRN, MSN, FNP-C Patient Care Center [facility] [address] [place], [place]  [address] [phone]  If 7PM-7AM, please contact night-coverage.  10/15/2019, 8:46 AM  LOS: 2 days

## 2019-10-16 LAB — TYPE AND SCREEN
ABO/RH(D): O POS
Antibody Screen: NEGATIVE
Donor AG Type: NEGATIVE
Unit division: 0
Unit division: 0
Unit division: 0

## 2019-10-16 LAB — BPAM RBC
Blood Product Expiration Date: 202011202359
Blood Product Expiration Date: 202011202359
Blood Product Expiration Date: 202011202359
ISSUE DATE / TIME: 202010182144
ISSUE DATE / TIME: 202010190016
ISSUE DATE / TIME: 202010201309
Unit Type and Rh: 5100
Unit Type and Rh: 5100
Unit Type and Rh: 5100

## 2019-10-16 MED ORDER — FERROUS SULFATE 325 (65 FE) MG PO TABS
325.0000 mg | ORAL_TABLET | Freq: Every day | ORAL | Status: DC
  Administered 2019-10-16: 12:00:00 325 mg via ORAL
  Filled 2019-10-16: qty 1

## 2019-10-16 MED ORDER — FERROUS SULFATE 325 (65 FE) MG PO TABS: 325.0000 mg | ORAL_TABLET | Freq: Every day | ORAL | 3 refills | Status: DC

## 2019-10-16 NOTE — Discharge Summary (Signed)
Physician Discharge Summary  Alfred Phillips NWG:[id number] DOB: [birth year] DOA: 10/13/2019  PCP: Patient, No Pcp Per  Admit date: 10/13/2019  Discharge date: 10/16/2019  Discharge Diagnoses:  Active Problems:   Sepsis (HCC)   Symptomatic anemia   Leukopenia   Homelessness   Discharge Condition: Stable  Disposition:  Follow-up Information    Massie Maroon, FNP Follow up in 1 week(s).   Specialty: Family Medicine Contact information: [address] [place] [address] [phone]          Pt is discharged home in good condition and is to follow up with Julianne Handler in 1 week to have labs evaluated. Kempton K Maxim is instructed to increase activity slowly and balance with rest for the next few days, and use prescribed medication to complete treatment of pain  Diet: Regular Wt Readings from Last 3 Encounters:  10/16/19 73.4 kg    History of present illness:   Alfred Phillips, a 33 year old male with possible history of sickle cell disease, patient unsure if he has sickle cell trait or sickle cell anemia but according to him he has always had low hemoglobin.  Has not been to the doctor in over 10 years.  He presented to the ER today with symptoms of fever, chills, generalized body aches, sore throat, and diffuse abdominal pain associated with nausea, vomiting, and decrease in taste and smell.  He is very sure he has not been in contact with any patient diagnosed with COVID-19, but unsure of exposure to asymptomatic and unconfirmed diagnosis patients.  He denies any recent travel.  He does not have cough or shortness of breath.  He denies any urinary symptoms or change in bowel habit.  He is not on any medications at this time.  He claims that when he was younger he is to be given some herbal concoctions to bring his hemoglobin level up.  Patient is borderline homeless, per EMS he was picked up at a laundromat.  ER course: Patient was found to be anemic with a hemoglobin of  5.2.  There was no prior hemoglobin level for comparison.  White cell count was low at 2.7.  Patient was febrile with T-max of 103 F, soft BP of 95/60, heart rate 105, initial lactic acid was 2.6.  Patient will be admitted for SIRS, symptoms of iron deficiency anemia, and possible sickle cell disease.  Hospital Course:   Symptomatic anemia: On admission, patient's hemoglobin was found to be 5.2.  Patient was tachycardic and febrile at the time.  Patient transfused 2 units of packed red blood cells.  Hemoglobin returned to 8.6 g/dL.  On review of laboratory values, indicative of iron deficiency anemia.  Patient started on ferrous sulfate 325 mg daily.  Prescription sent to pharmacy.  Patient will follow-up with this provider in 1 week to reevaluate hemoglobin.  On admission, patient was suspected to have sickle cell disease or sickle cell trait.  On review of hemoglobinopathy, sickle cell trait was confirmed.  Patient may warrant referral to hematology going forward due to iron deficiency anemia.   Leukopenia: WBCs 2.7 on admission.  Resolved.  HIV negative.  Will reevaluate CBC in 1 week.  Systemic inflammatory response syndrome: On admission, his fever of unknown origin.  Leukopenia, tachycardia, and tachypnea.  COVID-19 test negative, blood and urine cultures negative.  Chest x-ray unremarkable.  Empiric antibiotics initiated.  Patient remained afebrile throughout admission.  All laboratory values improved.  Homelessness: Patient states that he has  been residing with friends over the past several years.  He does not have a permanent address.  LCSW consulted.  Resources provided.  Patient is aware of follow-up.  Patient alert, oriented, and ambulating without assistance.  He is afebrile and maintaining oxygen saturation at 100% on RA.  Patient was discharged home today in a hemodynamically stable condition.   Discharge Exam: Vitals:   10/16/19 1100 10/16/19 1212  BP: (!) 138/108 (!)  126/92  Pulse:    Resp:    Temp:    SpO2:     Vitals:   10/16/19 0508 10/16/19 1056 10/16/19 1100 10/16/19 1212  BP: (!) 127/103 (!) 141/100 (!) 138/108 (!) 126/92  Pulse: (!) 57 63    Resp: 17 18    Temp: 98.3 F (36.8 C) 98.1 F (36.7 C)    TempSrc: Oral Oral    SpO2: 100% 100%    Weight: 73.4 kg     Height: 5\' 7"  (1.702 m)       General appearance : Awake, alert, not in any distress. Speech Clear. Not toxic looking HEENT: Atraumatic and Normocephalic, pupils equally reactive to light and accomodation Neck: Supple, no JVD. No cervical lymphadenopathy.  Chest: Good air entry bilaterally, no added sounds  CVS: S1 S2 regular, no murmurs.  Abdomen: Bowel sounds present, Non tender and not distended with no gaurding, rigidity or rebound. Extremities: B/L Lower Ext shows no edema, both legs are warm to touch Neurology: Awake alert, and oriented X 3, CN II-XII intact, Non focal Skin: No Rash  Discharge Instructions  Discharge Instructions    Discharge patient   Complete by: As directed    Discharge disposition: 01-Home or Self Care   Discharge patient date: 10/16/2019     Allergies as of 10/16/2019   No Known Allergies     Medication List    TAKE these medications   ferrous sulfate 325 (65 FE) MG tablet Take 1 tablet (325 mg total) by mouth daily with breakfast. Start taking on: October 17, 2019       The results of significant diagnostics from this hospitalization (including imaging, microbiology, ancillary and laboratory) are listed below for reference.    Significant Diagnostic Studies: Ct Abdomen Pelvis W Contrast  Result Date: 10/13/2019 CLINICAL DATA:  33 year old male with history of diffuse abdominal pain with nausea and vomiting. Fever to 103 degrees. Rectal bleeding. EXAM: CT ABDOMEN AND PELVIS WITH CONTRAST TECHNIQUE: Multidetector CT imaging of the abdomen and pelvis was performed using the standard protocol following bolus administration of  intravenous contrast. CONTRAST:  OMNIPAQUE IOHEXOL 300 MG/ML  SOLN COMPARISON:  No priors. FINDINGS: Lower chest: Unremarkable. Hepatobiliary: No suspicious cystic or solid hepatic lesions. No intra or extrahepatic biliary ductal dilatation. Gallbladder is normal in appearance. Pancreas: No pancreatic mass. No pancreatic ductal dilatation. No pancreatic or peripancreatic fluid collections or inflammatory changes. Spleen: Unremarkable. Adrenals/Urinary Tract: Bilateral kidneys and adrenal glands are normal in appearance. No hydroureteronephrosis. Urinary bladder is normal in appearance. Stomach/Bowel: Normal appearance of the stomach. No pathologic dilatation of small bowel or colon. The appendix is not confidently identified and may be surgically absent. Regardless, there are no inflammatory changes noted adjacent to the cecum to suggest the presence of an acute appendicitis at this time. Prominent hemorrhoidal veins adjacent to the distal rectum. Vascular/Lymphatic: No significant atherosclerotic disease, aneurysm or dissection noted in the abdominal or pelvic vasculature. No lymphadenopathy noted in the abdomen or pelvis. Reproductive: Prostate gland and seminal vesicles are unremarkable in  appearance. Other: No significant volume of ascites.  No pneumoperitoneum. Musculoskeletal: There are no aggressive appearing lytic or blastic lesions noted in the visualized portions of the skeleton. IMPRESSION: 1. No acute findings are noted to account for the patient's abdominal pain, nausea or vomiting. 2. There are prominent hemorrhoidal veins adjacent to the distal rectum, which may account for the patient's rectal bleeding. Electronically Signed   By: Trudie Reed M.D.   On: 10/13/2019 13:05   Dg Chest Portable 1 View  Result Date: 10/13/2019 CLINICAL DATA:  Fever, chills, headache, nausea/vomiting EXAM: PORTABLE CHEST 1 VIEW COMPARISON:  None. FINDINGS: Lungs are clear.  No pleural effusion or  pneumothorax. The heart is normal in size. IMPRESSION: No evidence of acute cardiopulmonary disease. Electronically Signed   By: Charline Bills M.D.   On: 10/13/2019 10:37    Microbiology: Recent Results (from the past 240 hour(s))  SARS CORONAVIRUS 2 (TAT 6-24 HRS) Nasopharyngeal Nasopharyngeal Swab     Status: None   Collection Time: 10/13/19 10:46 AM   Specimen: Nasopharyngeal Swab  Result Value Ref Range Status   SARS Coronavirus 2 NEGATIVE NEGATIVE Final    Comment: (NOTE) SARS-CoV-2 target nucleic acids are NOT DETECTED. The SARS-CoV-2 RNA is generally detectable in upper and lower respiratory specimens during the acute phase of infection. Negative results do not preclude SARS-CoV-2 infection, do not rule out co-infections with other pathogens, and should not be used as the sole basis for treatment or other patient management decisions. Negative results must be combined with clinical observations, patient history, and epidemiological information. The expected result is Negative. Fact Sheet for Patients: HairSlick.no Fact Sheet for Healthcare Providers: quierodirigir.com This test is not yet approved or cleared by the [place] FDA and  has been authorized for detection and/or diagnosis of SARS-CoV-2 by FDA under an Emergency Use Authorization (EUA). This EUA will remain  in effect (meaning this test can be used) for the duration of the COVID-19 declaration under Section 56 4(b)(1) of the Act, 21 U.S.C. section 360bbb-3(b)(1), unless the authorization is terminated or revoked sooner. Performed at [facility], [address] [place], [place] [address]   Culture, blood (routine x 2)     Status: None (Preliminary result)   Collection Time: 10/13/19  4:52 PM   Specimen: BLOOD  Result Value Ref Range Status   Specimen Description   Final    BLOOD RIGHT ANTECUBITAL Performed at [facility],  [address] [place], [place] [address]    Special Requests   Final    BOTTLES DRAWN AEROBIC AND ANAEROBIC Blood Culture results may not be optimal due to an excessive volume of blood received in culture bottles Performed at [facility], [address] [place], [place] [address]    Culture   Final    NO GROWTH 3 DAYS Performed at [facility], [address] [place], [place] [address]    Report Status PENDING  Incomplete  Culture, blood (routine x 2)     Status: None (Preliminary result)   Collection Time: 10/13/19  4:53 PM   Specimen: BLOOD RIGHT FOREARM  Result Value Ref Range Status   Specimen Description   Final    BLOOD RIGHT FOREARM Performed at [facility], [address] [place], [place] [address]    Special Requests   Final    BOTTLES DRAWN AEROBIC AND ANAEROBIC Blood Culture adequate volume Performed at [facility], [address] [place], [place] [address]    Culture   Final    NO GROWTH 3 DAYS Performed at [facility], [address], [place], [place] [address]    Report Status PENDING  Incomplete     Labs: Basic Metabolic Panel: Recent Labs  Lab 10/13/19 1046  NA 139  K 4.0  CL 107  CO2 21*  GLUCOSE 74  BUN 12  CREATININE 0.91  CALCIUM 7.8*   Liver Function Tests: Recent Labs  Lab 10/13/19 1546  AST 79*  ALT 20  ALKPHOS 45  BILITOT 0.6  PROT 6.4*  ALBUMIN 3.4*   Recent Labs  Lab 10/13/19 1046  LIPASE 39   No results for input(s): AMMONIA in the last 168 hours. CBC: Recent Labs  Lab 10/13/19 1046 10/14/19 0458 10/15/19 0507 10/15/19 2014  WBC 2.7* 8.9 5.5  --   NEUTROABS 2.5 7.3  --   --   HGB 5.2* 6.6* 6.8* 8.6*  HCT 20.2* 24.0* 25.2* 29.8*  MCV 66.4* 72.9* 73.0*  --   PLT 247 186 147*  --    Cardiac Enzymes: No results for input(s): CKTOTAL, CKMB, CKMBINDEX, TROPONINI in the last 168 hours. BNP: Invalid input(s): POCBNP CBG: No results for  input(s): GLUCAP in the last 168 hours.  Time coordinating discharge: 50 minutes  Signed:  Nolon Nations  APRN, MSN, FNP-C Patient Care [facility] [address] [place], [place] [address] [phone]  [facility] 10/16/2019, 5:57 PM    This note was prepared using Dragon speech recognition software, errors in dictation are unintentional.

## 2019-10-16 NOTE — TOC Transition Note (Signed)
Transition of Care [facility]) - CM/SW Discharge Note   Patient Details  Name: Alfred Phillips MRN: [id number] Date of Birth: 1986/03/10  Transition of Care [facility]) CM/SW Contact:  Nila Nephew, LCSW Phone Number: [phone] 10/16/2019, 9:51 AM   Clinical Narrative:   Followed up with pt today re: his housing situation- reports he has been staying with friends for the past year (since moving out of his parents' due to relational issues) but has no stable housing. Reports underlying issue is that he lost his work permit last year and doesn't have the funds to apply for new one. 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 CSW will provide resources in [place], pt states feels confident in being able to follow up with contacts given (Pt is not interested in shelter resources or placement).    Pt reports he is going to follow up at sickle cell clinic for primary care.     Final next level of care: Home/Self Care Barriers to Discharge: No Barriers Identified   Patient Goals and CMS Choice Patient states their goals for this hospitalization and ongoing recovery are:: Get work permit back      Discharge Placement                       Discharge Plan and Services                DME Arranged: N/A DME Agency: NA       HH Arranged: NA HH Agency: NA        Social Determinants of Health (SDOH) Interventions     Readmission Risk Interventions No flowsheet data found.

## 2019-10-16 NOTE — Discharge Instructions (Addendum)
Housing resource/information [facility] [place]  Phone: [phone] [facility]  Phone: [phone]   [facility]) Address: [address] [place], [place] [address] [phone]  Iron Deficiency Anemia, Adult Iron-deficiency anemia is when you have a low amount of red blood cells or hemoglobin. This happens because you have too little iron in your body. Hemoglobin carries oxygen to parts of the body. Anemia can cause your body to not get enough oxygen. It may or may not cause symptoms. Follow these instructions at home: Medicines  Take over-the-counter and prescription medicines only as told by your doctor. This includes iron pills (supplements) and vitamins.  If you cannot handle taking iron pills by mouth, ask your doctor about getting iron through: ? A vein (intravenously). ? A shot (injection) into a muscle.  Take iron pills when your stomach is empty. If you cannot handle this, take them with food.  Do not drink milk or take antacids at the same time as your iron pills.  To prevent trouble pooping (constipation), eat fiber or take medicine (stool softener) as told by your doctor. Eating and drinking   Talk with your doctor before changing the foods you eat. He or she may tell you to eat foods that have a lot of iron, such as: ? Liver. ? Lowfat (lean) beef. ? Breads and cereals that have iron added to them (fortified breads and cereals). ? Eggs. ? Dried fruit. ? Dark green, leafy vegetables.  Drink enough fluid to keep your pee (urine) clear or pale yellow.  Eat fresh fruits and vegetables that are high in vitamin C. They help your body to use iron. Foods with a lot of vitamin C include: ? Oranges. ? Peppers. ? Tomatoes. ? Mangoes. General instructions  Return to your normal activities as told by your doctor. Ask your doctor what activities are safe  for you.  Keep yourself clean, and keep things clean around you (your surroundings). Anemia can make you get sick more easily.  Keep all follow-up visits as told by your doctor. This is important. Contact a doctor if:  You feel sick to your stomach (nauseous).  You throw up (vomit).  You feel weak.  You are sweating for no clear reason.  You have trouble pooping, such as: ? Pooping (having a bowel movement) less than 3 times a week. ? Straining to poop. ? Having poop that is hard, dry, or larger than normal. ? Feeling full or bloated. ? Pain in the lower belly. ? Not feeling better after pooping. Get help right away if:  You pass out (faint). If this happens, do not drive yourself to the hospital. Call your local emergency services (911 in the [place]  You have chest pain.  You have shortness of breath that: ? Is very bad. ? Gets worse with physical activity.  You have a fast heartbeat.  You get light-headed when getting up from sitting or lying down. This information is not intended to replace advice given to you by your health care provider. Make sure you discuss any questions you have with your health care provider. Document Released: 01/14/2011 Document Revised: 11/24/2017 Document Reviewed: 08/31/2016 Elsevier Patient Education  2020 [employer].

## 2019-10-18 LAB — CULTURE, BLOOD (ROUTINE X 2)
Culture: NO GROWTH
Culture: NO GROWTH
Special Requests: ADEQUATE

## 2020-02-24 IMAGING — CT CT ABD-PELV W/ CM
2 of 4 series · 16 of 46 positions shown, 18 images · IV contrast (omnipaque)
Comparison: No priors.

CLINICAL DATA: 32-year-old male with history of diffuse abdominal
pain with nausea and vomiting. Fever to 103 degrees. Rectal
bleeding.

EXAM:
CT ABDOMEN AND PELVIS WITH CONTRAST
TECHNIQUE: Multidetector CT imaging of the abdomen and pelvis was performed
using the standard protocol following bolus administration of
intravenous contrast.
CONTRAST:  100mL OMNIPAQUE IOHEXOL 300 MG/ML  SOLN

[Series 2: axial st · axial · 0.72mm/px · z∈[-462,-56]mm · 13 of 93 slices shown, 15 images]
[im 6/93  soft-tissue]
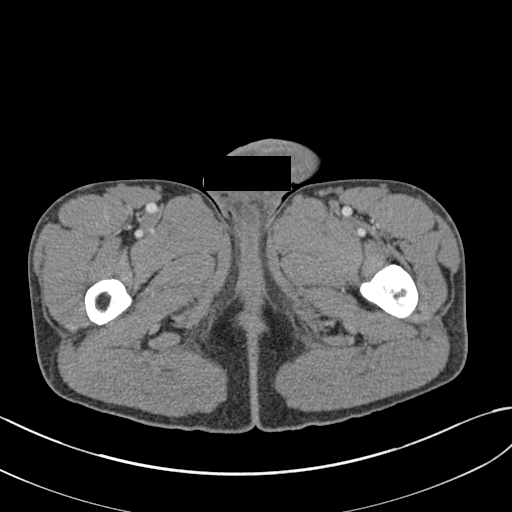
[im 6/93  bone]
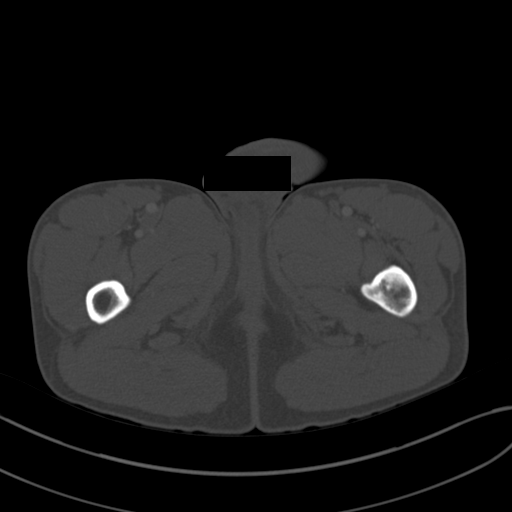
[im 12/93  soft-tissue]
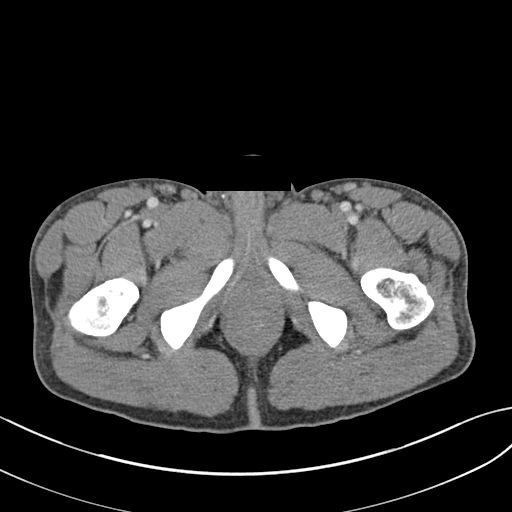
[im 18/93  soft-tissue]
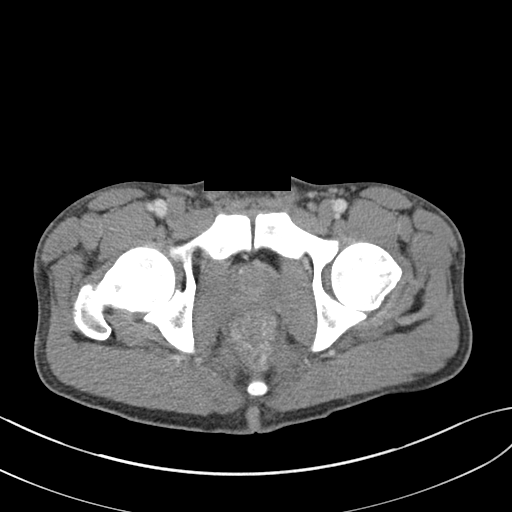
[im 29/93  soft-tissue]
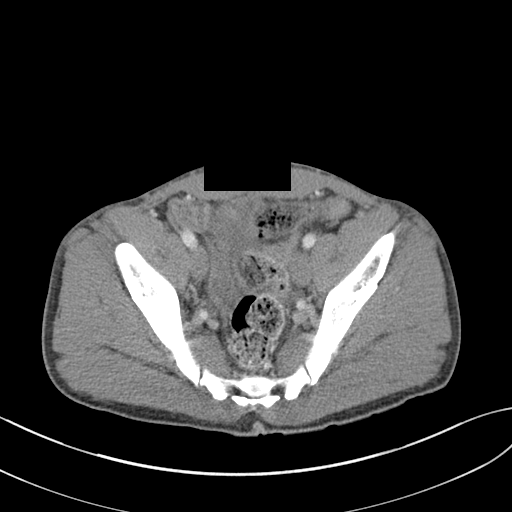
[im 35/93  soft-tissue]
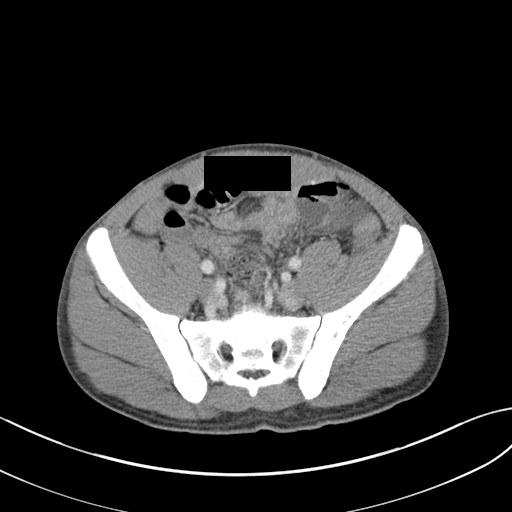
[im 41/93  soft-tissue]
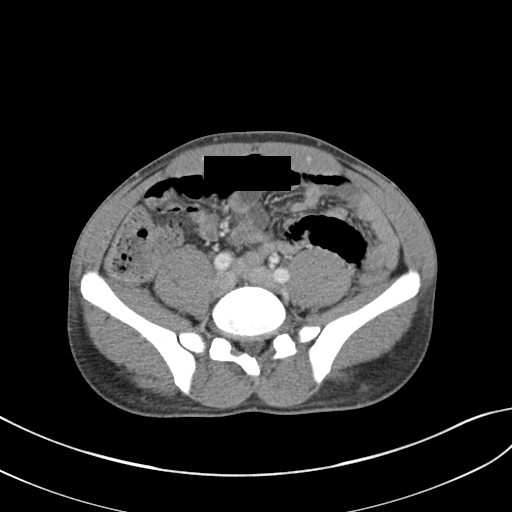
[im 47/93  soft-tissue]
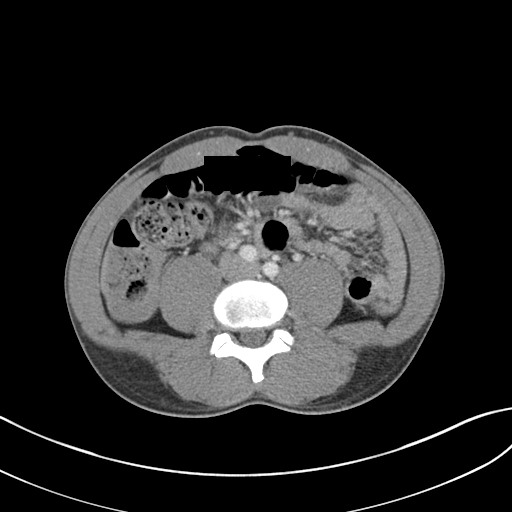
[im 52/93  soft-tissue]
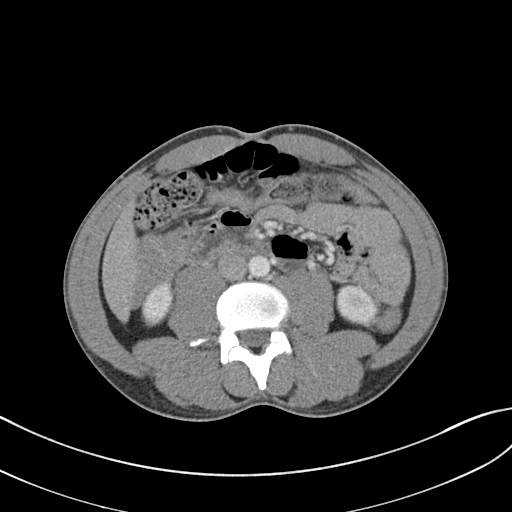
[im 58/93  soft-tissue]
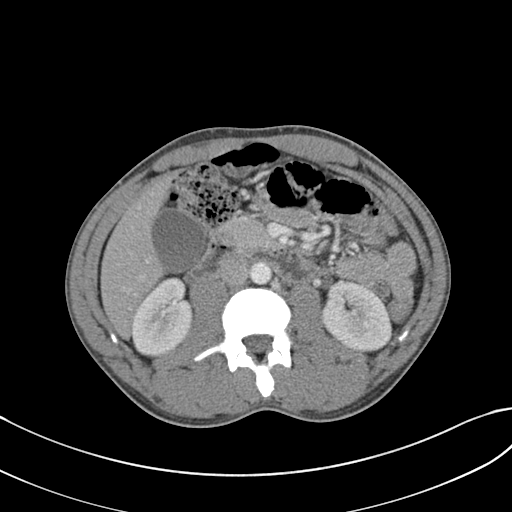
[im 58/93  bone]
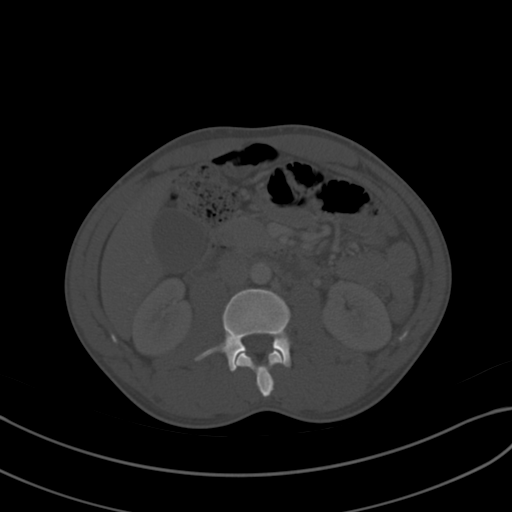
[im 64/93  soft-tissue]
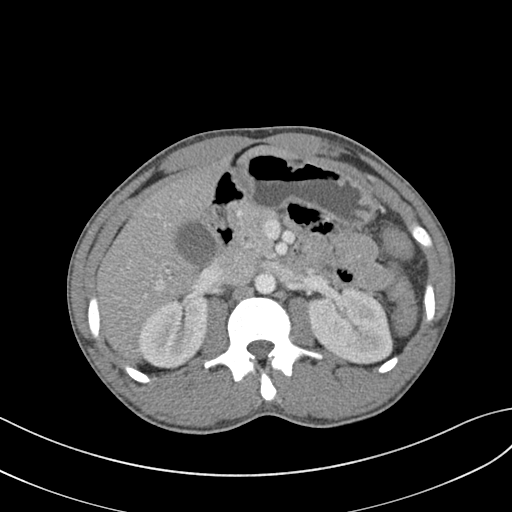
[im 75/93  soft-tissue]
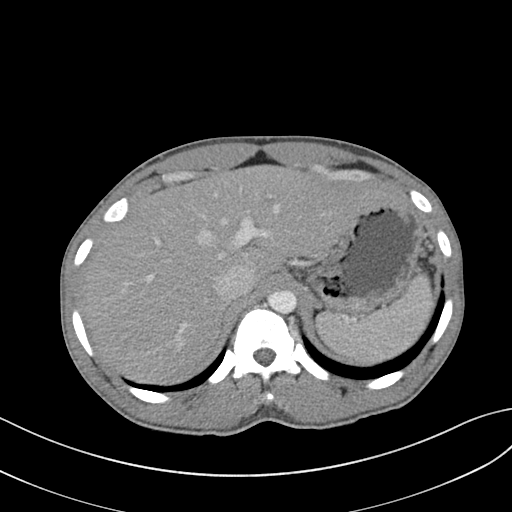
[im 81/93  soft-tissue]
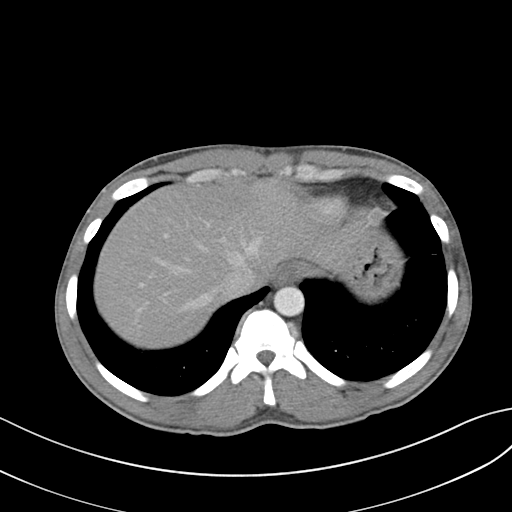
[im 87/93  soft-tissue]
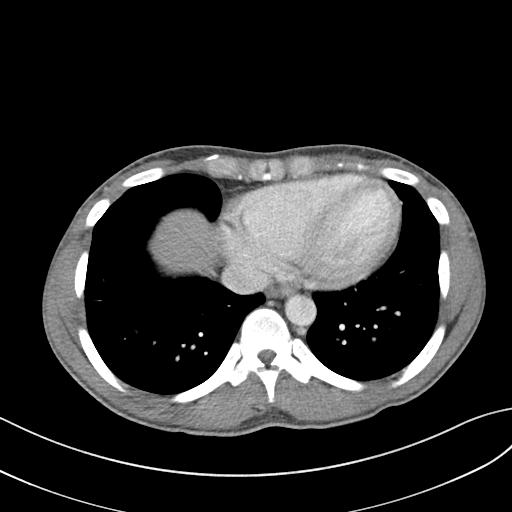

[Series 4: coronal st · coronal · 0.74mm/px · 3 of 110 slices shown]
[im 37/110  soft-tissue]
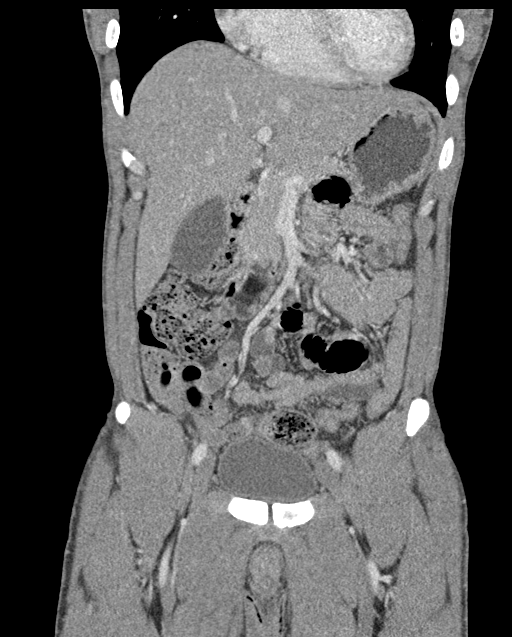
[im 49/110  soft-tissue]
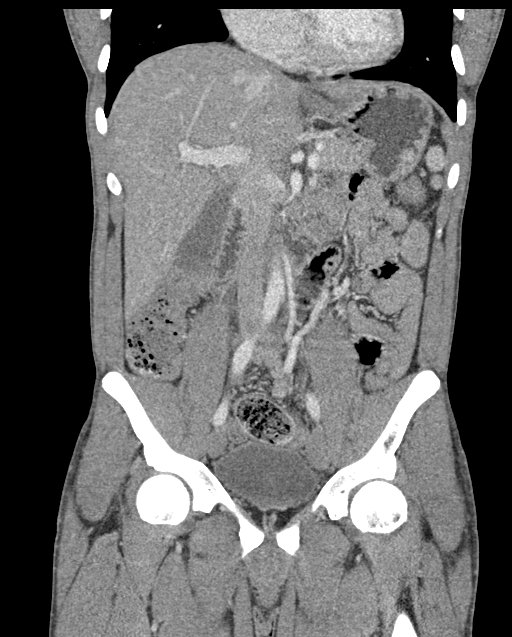
[im 61/110  soft-tissue]
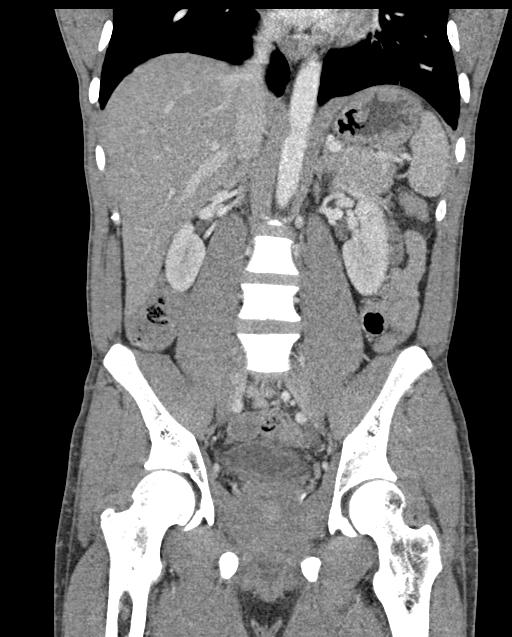

[16 of 46 positions shown; findings below may reference images not displayed]

FINDINGS: Lower chest: Unremarkable.

Hepatobiliary: No suspicious cystic or solid hepatic lesions. No
intra or extrahepatic biliary ductal dilatation. Gallbladder is
normal in appearance.

Pancreas: No pancreatic mass. No pancreatic ductal dilatation. No
pancreatic or peripancreatic fluid collections or inflammatory
changes.

Spleen: Unremarkable.

Adrenals/Urinary Tract: Bilateral kidneys and adrenal glands are
normal in appearance. No hydroureteronephrosis. Urinary bladder is
normal in appearance.

Stomach/Bowel: Normal appearance of the stomach. No pathologic
dilatation of small bowel or colon. The appendix is not confidently
identified and may be surgically absent. Regardless, there are no
inflammatory changes noted adjacent to the cecum to suggest the
presence of an acute appendicitis at this time. Prominent
hemorrhoidal veins adjacent to the distal rectum.

Vascular/Lymphatic: No significant atherosclerotic disease, aneurysm
or dissection noted in the abdominal or pelvic vasculature. No
lymphadenopathy noted in the abdomen or pelvis.

Reproductive: Prostate gland and seminal vesicles are unremarkable
in appearance.

Other: No significant volume of ascites.  No pneumoperitoneum.

Musculoskeletal: There are no aggressive appearing lytic or blastic
lesions noted in the visualized portions of the skeleton.
IMPRESSION: 1. No acute findings are noted to account for the patient's
abdominal pain, nausea or vomiting.
2. There are prominent hemorrhoidal veins adjacent to the distal
rectum, which may account for the patient's rectal bleeding.

## 2021-09-12 ENCOUNTER — Other Ambulatory Visit: Payer: Self-pay

## 2021-09-12 ENCOUNTER — Emergency Department (HOSPITAL_COMMUNITY)
Admission: EM | Admit: 2021-09-12 | Discharge: 2021-09-12 | Disposition: A | Discharge status: Home / Self Care | Payer: Self-pay | Attending: Emergency Medicine | Admitting: Emergency Medicine

## 2021-09-12 ENCOUNTER — Encounter (HOSPITAL_COMMUNITY): Payer: Self-pay

## 2021-09-12 DIAGNOSIS — Z5321 Procedure and treatment not carried out due to patient leaving prior to being seen by health care provider: Secondary | ICD-10-CM | POA: Insufficient documentation

## 2021-09-12 DIAGNOSIS — T50901A Poisoning by unspecified drugs, medicaments and biological substances, accidental (unintentional), initial encounter: Secondary | ICD-10-CM | POA: Insufficient documentation

## 2021-09-12 NOTE — ED Triage Notes (Signed)
Pt BIB [employer]. Pt was found in front of a club laid over on the ground. Per [employer] pt had overdose on something but was unsure of what he had taken.

## 2021-09-12 NOTE — ED Notes (Signed)
Pt began to yell and curse out staff and [employer]. Pt refused any care from staff. Pt being to yell and scream at [employer] while walking out of the room. Pt was able to ambulate with steady gait on his way out of the hospital. MD made aware pt walked out.

## 2021-09-12 NOTE — ED Provider Notes (Signed)
18:15  Pt arrived to ED following overdose, given narcan per [employer] who were in the hallway outside of the patient's room in the ED. Patient aggressive towards PD and hyper-verbal. Refusing to complete evaluation or to stay in his room. I did not physically examine or speak to this patient. He eloped from the ED prior to my assessment. Observed leaving the ED with [employer] with steady gait that was unassisted.     Sloan Leiter, DO 09/12/21 1823

## 2023-10-04 ENCOUNTER — Ambulatory Visit (INDEPENDENT_AMBULATORY_CARE_PROVIDER_SITE_OTHER)
Admission: EM | Admit: 2023-10-04 | Discharge: 2023-10-04 | Disposition: A | Discharge status: Other Acute Inpatient Hospital | Payer: 59 | Source: Home / Self Care

## 2023-10-04 ENCOUNTER — Other Ambulatory Visit: Payer: Self-pay

## 2023-10-04 ENCOUNTER — Emergency Department (HOSPITAL_COMMUNITY): Payer: 59

## 2023-10-04 ENCOUNTER — Inpatient Hospital Stay (HOSPITAL_COMMUNITY)
Admission: EM | Admit: 2023-10-04 | Discharge: 2023-10-11 | DRG: 809 | Disposition: A | Discharge status: Home / Self Care | Payer: 59 | Attending: Family Medicine | Admitting: Family Medicine

## 2023-10-04 ENCOUNTER — Encounter (HOSPITAL_COMMUNITY): Payer: Self-pay

## 2023-10-04 DIAGNOSIS — D5700 Hb-SS disease with crisis, unspecified: Secondary | ICD-10-CM | POA: Diagnosis not present

## 2023-10-04 DIAGNOSIS — F29 Unspecified psychosis not due to a substance or known physiological condition: Secondary | ICD-10-CM

## 2023-10-04 DIAGNOSIS — D649 Anemia, unspecified: Secondary | ICD-10-CM

## 2023-10-04 DIAGNOSIS — D638 Anemia in other chronic diseases classified elsewhere: Secondary | ICD-10-CM | POA: Insufficient documentation

## 2023-10-04 DIAGNOSIS — D61818 Other pancytopenia: Principal | ICD-10-CM

## 2023-10-04 DIAGNOSIS — R4182 Altered mental status, unspecified: Secondary | ICD-10-CM | POA: Diagnosis not present

## 2023-10-04 DIAGNOSIS — D500 Iron deficiency anemia secondary to blood loss (chronic): Secondary | ICD-10-CM

## 2023-10-04 DIAGNOSIS — D571 Sickle-cell disease without crisis: Secondary | ICD-10-CM | POA: Insufficient documentation

## 2023-10-04 DIAGNOSIS — D696 Thrombocytopenia, unspecified: Secondary | ICD-10-CM | POA: Insufficient documentation

## 2023-10-04 DIAGNOSIS — R456 Violent behavior: Secondary | ICD-10-CM | POA: Insufficient documentation

## 2023-10-04 DIAGNOSIS — M549 Dorsalgia, unspecified: Secondary | ICD-10-CM | POA: Insufficient documentation

## 2023-10-04 DIAGNOSIS — Z789 Other specified health status: Secondary | ICD-10-CM

## 2023-10-04 DIAGNOSIS — F1994 Other psychoactive substance use, unspecified with psychoactive substance-induced mood disorder: Secondary | ICD-10-CM | POA: Insufficient documentation

## 2023-10-04 DIAGNOSIS — G4700 Insomnia, unspecified: Secondary | ICD-10-CM | POA: Diagnosis present

## 2023-10-04 DIAGNOSIS — F1010 Alcohol abuse, uncomplicated: Secondary | ICD-10-CM | POA: Diagnosis present

## 2023-10-04 DIAGNOSIS — R011 Cardiac murmur, unspecified: Secondary | ICD-10-CM | POA: Diagnosis not present

## 2023-10-04 DIAGNOSIS — F1210 Cannabis abuse, uncomplicated: Secondary | ICD-10-CM | POA: Diagnosis not present

## 2023-10-04 DIAGNOSIS — Z79899 Other long term (current) drug therapy: Secondary | ICD-10-CM

## 2023-10-04 DIAGNOSIS — A523 Neurosyphilis, unspecified: Secondary | ICD-10-CM | POA: Diagnosis not present

## 2023-10-04 DIAGNOSIS — F23 Brief psychotic disorder: Secondary | ICD-10-CM | POA: Diagnosis not present

## 2023-10-04 DIAGNOSIS — Z5900 Homelessness unspecified: Secondary | ICD-10-CM | POA: Diagnosis not present

## 2023-10-04 DIAGNOSIS — R0989 Other specified symptoms and signs involving the circulatory and respiratory systems: Secondary | ICD-10-CM | POA: Diagnosis not present

## 2023-10-04 DIAGNOSIS — F419 Anxiety disorder, unspecified: Secondary | ICD-10-CM | POA: Diagnosis present

## 2023-10-04 DIAGNOSIS — K429 Umbilical hernia without obstruction or gangrene: Secondary | ICD-10-CM | POA: Diagnosis not present

## 2023-10-04 DIAGNOSIS — F609 Personality disorder, unspecified: Secondary | ICD-10-CM | POA: Diagnosis not present

## 2023-10-04 DIAGNOSIS — C9590 Leukemia, unspecified not having achieved remission: Secondary | ICD-10-CM | POA: Diagnosis present

## 2023-10-04 DIAGNOSIS — I493 Ventricular premature depolarization: Secondary | ICD-10-CM | POA: Diagnosis not present

## 2023-10-04 DIAGNOSIS — F1090 Alcohol use, unspecified, uncomplicated: Secondary | ICD-10-CM

## 2023-10-04 DIAGNOSIS — Z5320 Procedure and treatment not carried out because of patient's decision for unspecified reasons: Secondary | ICD-10-CM | POA: Diagnosis not present

## 2023-10-04 DIAGNOSIS — F17210 Nicotine dependence, cigarettes, uncomplicated: Secondary | ICD-10-CM | POA: Diagnosis not present

## 2023-10-04 DIAGNOSIS — K759 Inflammatory liver disease, unspecified: Secondary | ICD-10-CM | POA: Diagnosis not present

## 2023-10-04 DIAGNOSIS — Z1152 Encounter for screening for COVID-19: Secondary | ICD-10-CM

## 2023-10-04 DIAGNOSIS — J9811 Atelectasis: Secondary | ICD-10-CM | POA: Diagnosis not present

## 2023-10-04 DIAGNOSIS — Y908 Blood alcohol level of 240 mg/100 ml or more: Secondary | ICD-10-CM | POA: Diagnosis not present

## 2023-10-04 DIAGNOSIS — F319 Bipolar disorder, unspecified: Secondary | ICD-10-CM | POA: Diagnosis not present

## 2023-10-04 LAB — COMPREHENSIVE METABOLIC PANEL
ALT: 12 U/L (ref 0–44)
ALT: 11 U/L (ref 0–44)
AST: 20 U/L (ref 15–41)
AST: 16 U/L (ref 15–41)
Albumin: 3.9 g/dL (ref 3.5–5.0)
Albumin: 3.3 g/dL — ABNORMAL LOW (ref 3.5–5.0)
Alkaline Phosphatase: 41 U/L (ref 38–126)
Alkaline Phosphatase: 37 U/L — ABNORMAL LOW (ref 38–126)
Anion gap: 11 (ref 5–15)
Anion gap: 13 (ref 5–15)
BUN: 6 mg/dL (ref 6–20)
BUN: 6 mg/dL (ref 6–20)
CO2: 24 mmol/L (ref 22–32)
CO2: 22 mmol/L (ref 22–32)
Calcium: 8.6 mg/dL — ABNORMAL LOW (ref 8.9–10.3)
Calcium: 8.3 mg/dL — ABNORMAL LOW (ref 8.9–10.3)
Chloride: 110 mmol/L (ref 98–111)
Chloride: 111 mmol/L (ref 98–111)
Creatinine, Ser: 0.91 mg/dL (ref 0.61–1.24)
Creatinine, Ser: 0.95 mg/dL (ref 0.61–1.24)
GFR, Estimated: >60 mL/min (ref >60)
GFR, Estimated: >60 mL/min (ref >60)
Glucose, Bld: 84 mg/dL (ref 70–99)
Glucose, Bld: 86 mg/dL (ref 70–99)
Potassium: 4 mmol/L (ref 3.5–5.1)
Potassium: 3.8 mmol/L (ref 3.5–5.1)
Sodium: 145 mmol/L (ref 135–145)
Sodium: 146 mmol/L — ABNORMAL HIGH (ref 135–145)
Total Bilirubin: 0.5 mg/dL (ref 0.3–1.2)
Total Bilirubin: 0.3 mg/dL (ref 0.3–1.2)
Total Protein: 6.8 g/dL (ref 6.5–8.1)
Total Protein: 5.8 g/dL — ABNORMAL LOW (ref 6.5–8.1)

## 2023-10-04 LAB — CBC WITH DIFFERENTIAL/PLATELET
Abs Immature Granulocytes: 0 10*3/uL (ref 0.00–0.07)
Basophils Absolute: 0.1 10*3/uL (ref 0.0–0.1)
Basophils Relative: 7 %
Eosinophils Absolute: 0 10*3/uL (ref 0.0–0.5)
Eosinophils Relative: 0 %
HCT: 15.1 % — ABNORMAL LOW (ref 39.0–52.0)
Hemoglobin: 4 g/dL — CL (ref 13.0–17.0)
Lymphocytes Relative: 67 %
Lymphs Abs: 1.1 10*3/uL (ref 0.7–4.0)
MCH: 17.1 pg — ABNORMAL LOW (ref 26.0–34.0)
MCHC: 26.5 g/dL — ABNORMAL LOW (ref 30.0–36.0)
MCV: 64.5 fL — ABNORMAL LOW (ref 80.0–100.0)
Monocytes Absolute: 0.1 10*3/uL (ref 0.1–1.0)
Monocytes Relative: 4 %
Neutro Abs: 0.4 10*3/uL — CL (ref 1.7–7.7)
Neutrophils Relative %: 22 %
Platelets: 94 10*3/uL — ABNORMAL LOW (ref 150–400)
RBC: 2.34 MIL/uL — ABNORMAL LOW (ref 4.22–5.81)
RDW: 28.9 % — ABNORMAL HIGH (ref 11.5–15.5)
WBC: 1.7 10*3/uL — ABNORMAL LOW (ref 4.0–10.5)
nRBC: 2.3 % — ABNORMAL HIGH (ref 0.0–0.2)

## 2023-10-04 LAB — RETICULOCYTES
Immature Retic Fract: 40.6 % — ABNORMAL HIGH (ref 2.3–15.9)
RBC.: 2.01 MIL/uL — ABNORMAL LOW (ref 4.22–5.81)
Retic Count, Absolute: 17.1 10*3/uL — ABNORMAL LOW (ref 19.0–186.0)
Retic Ct Pct: 0.9 % (ref 0.4–3.1)

## 2023-10-04 LAB — CBC
HCT: 13.1 % — ABNORMAL LOW (ref 39.0–52.0)
Hemoglobin: 3.5 g/dL — CL (ref 13.0–17.0)
MCH: 17.7 pg — ABNORMAL LOW (ref 26.0–34.0)
MCHC: 26.7 g/dL — ABNORMAL LOW (ref 30.0–36.0)
MCV: 66.2 fL — ABNORMAL LOW (ref 80.0–100.0)
Platelets: 72 10*3/uL — ABNORMAL LOW (ref 150–400)
RBC: 1.98 MIL/uL — ABNORMAL LOW (ref 4.22–5.81)
RDW: 29.3 % — ABNORMAL HIGH (ref 11.5–15.5)
WBC: 2.2 10*3/uL — ABNORMAL LOW (ref 4.0–10.5)
nRBC: 1.4 % — ABNORMAL HIGH (ref 0.0–0.2)

## 2023-10-04 LAB — LIPID PANEL
Cholesterol: 113 mg/dL (ref 0–200)
HDL: 48 mg/dL (ref >40)
LDL Cholesterol: 52 mg/dL (ref 0–99)
Total CHOL/HDL Ratio: 2.4 {ratio}
Triglycerides: 63 mg/dL (ref <150)
VLDL: 13 mg/dL (ref 0–40)

## 2023-10-04 LAB — POCT URINE DRUG SCREEN - MANUAL ENTRY (I-SCREEN)
POC Amphetamine UR: NOT DETECTED
POC Buprenorphine (BUP): NOT DETECTED
POC Cocaine UR: NOT DETECTED
POC Marijuana UR: NOT DETECTED
POC Methadone UR: NOT DETECTED
POC Methamphetamine UR: NOT DETECTED
POC Morphine: NOT DETECTED
POC Oxazepam (BZO): NOT DETECTED
POC Oxycodone UR: NOT DETECTED
POC Secobarbital (BAR): NOT DETECTED

## 2023-10-04 LAB — IRON AND TIBC
Iron: 13 ug/dL — ABNORMAL LOW (ref 45–182)
Saturation Ratios: 3 % — ABNORMAL LOW (ref 17.9–39.5)
TIBC: 441 ug/dL (ref 250–450)
UIBC: 428 ug/dL

## 2023-10-04 LAB — ETHANOL: Alcohol, Ethyl (B): 218 mg/dL — ABNORMAL HIGH (ref <10)

## 2023-10-04 LAB — RESPIRATORY PANEL BY PCR

## 2023-10-04 LAB — PREPARE RBC (CROSSMATCH)

## 2023-10-04 LAB — TSH: TSH: 0.799 u[IU]/mL (ref 0.350–4.500)

## 2023-10-04 LAB — FERRITIN: Ferritin: 3 ng/mL — ABNORMAL LOW (ref 24–336)

## 2023-10-04 LAB — ABO/RH: ABO/RH(D): O POS

## 2023-10-04 LAB — HEMOGLOBIN A1C
Hgb A1c MFr Bld: 5.5 % (ref 4.8–5.6)
Mean Plasma Glucose: 111.15 mg/dL

## 2023-10-04 LAB — LACTATE DEHYDROGENASE: LDH: 70 U/L — ABNORMAL LOW (ref 98–192)

## 2023-10-04 LAB — VITAMIN B12: Vitamin B-12: 432 pg/mL (ref 180–914)

## 2023-10-04 MED ORDER — HYDROXYZINE HCL 25 MG PO TABS: 25.0000 mg | ORAL_TABLET | Freq: Three times a day (TID) | ORAL | Status: DC | PRN

## 2023-10-04 MED ORDER — ADULT MULTIVITAMIN W/MINERALS CH
1.0000 | ORAL_TABLET | Freq: Every day | ORAL | Status: DC
  Administered 2023-10-07 – 2023-10-11 (×5): 1 via ORAL
  Filled 2023-10-04 (×7): qty 1

## 2023-10-04 MED ORDER — ALUM & MAG HYDROXIDE-SIMETH 200-200-20 MG/5ML PO SUSP: 30.0000 mL | ORAL | Status: DC | PRN

## 2023-10-04 MED ORDER — THIAMINE MONONITRATE 100 MG PO TABS
100.0000 mg | ORAL_TABLET | Freq: Every day | ORAL | Status: DC
  Administered 2023-10-07 – 2023-10-11 (×5): 100 mg via ORAL
  Filled 2023-10-04 (×7): qty 1

## 2023-10-04 MED ORDER — TRAZODONE HCL 50 MG PO TABS: 50.0000 mg | ORAL_TABLET | Freq: Every evening | ORAL | Status: DC | PRN

## 2023-10-04 MED ORDER — ZIPRASIDONE MESYLATE 20 MG IM SOLR
20.0000 mg | Freq: Once | INTRAMUSCULAR | Status: AC
  Administered 2023-10-04: 20 mg via INTRAMUSCULAR
  Filled 2023-10-04: qty 20

## 2023-10-04 MED ORDER — SODIUM CHLORIDE 0.9% IV SOLUTION: Freq: Once | INTRAVENOUS | Status: AC

## 2023-10-04 MED ORDER — MAGNESIUM HYDROXIDE 400 MG/5ML PO SUSP: 30.0000 mL | Freq: Every day | ORAL | Status: DC | PRN

## 2023-10-04 MED ORDER — THIAMINE HCL 100 MG/ML IJ SOLN: 100.0000 mg | Freq: Every day | INTRAMUSCULAR | Status: DC

## 2023-10-04 MED ORDER — OLANZAPINE 5 MG PO TBDP
5.0000 mg | ORAL_TABLET | Freq: Two times a day (BID) | ORAL | Status: DC
  Filled 2023-10-04: qty 1

## 2023-10-04 MED ORDER — ACETAMINOPHEN 325 MG PO TABS: 650.0000 mg | ORAL_TABLET | Freq: Four times a day (QID) | ORAL | Status: DC | PRN

## 2023-10-04 MED ORDER — FOLIC ACID 1 MG PO TABS
1.0000 mg | ORAL_TABLET | Freq: Every day | ORAL | Status: DC
  Filled 2023-10-04: qty 1

## 2023-10-04 MED ORDER — STERILE WATER FOR INJECTION IJ SOLN
INTRAMUSCULAR | Status: AC
  Filled 2023-10-04: qty 10

## 2023-10-04 NOTE — ED Provider Notes (Signed)
[facility] Continuous Assessment Admission H&P  Date: 10/04/23 Patient Name: Alfred Phillips MRN: [id number] Chief Complaint: " Why are you holding me against my will"  Diagnoses:  Final diagnoses:  Substance induced mood disorder [facility])    HPI:  Alfred Phillips 37 y.o., male patient presented to [facility] as a walk in,  accompanied by law enforcement under IVC Petition.  Patient states, I was brought here for "playing music at 5:00 am".   Alfred Phillips, 37 y.o., male patient seen face to face by this provider, consulted with Dr. Lucianne Muss; and chart reviewed on 10/04/23.   IVC Petition Reads as Follows: " THE RESPONDENT HAS BEEN UP ALL NIGHT, HE SAYS HE DOESN'T have time to eat or sleep.  The respondent believes he is a God and the devil.  Responded has threatened to kill his family by poisoning their food.  The respondent rambles when he speaks, making nonsensical statements.  The respondent is aggressive towards me and the rest of the family.  The respondent drinks and smokes marijuana daily. IVC petitioner is Allen Norris patient's mother.  On evaluation Alfred Phillips reports the only thing he was doing is listening to his music rule out and the police came and got him and brought him here to [facility] [place].  Patient is very guarded and appears hypervigilant.  When asked about alcohol patient stated that he drank alcohol all day on Tuesday.  He denies any use of any illicit substances.  Patient became visibly irritable and angry when told that he would have to remain here at this facility while collateral is obtained to substantiate or disqualify his need for mental health treatment.  Patient denies any mental health illness and has never been treated with any mental health medications.  Patient after being told he would have to remain here at this facility also refused answering additional questions however did ask for something for he reports he has not been  drinking in several days. Patient also did acknowledge that he has been sleeping over the last few days. IVC petitions listed mother's name as the petitioner however did not indicate a phone number for patient's mother to be contacted to obtain collateral.    During evaluation Alfred Phillips is sitting in upright position in no acute distress. He is alert, oriented x 4, labile (irritable, anxious, euthymic, guarded), cooperative, with inattentiveness at times.  His mood is guarded euthymic with congruent affect. His speech is clear with increased volume, periods of pauses consistent with thought blocking vs selectively mute.  Objectively, patient doesn't appear to be responding to internal stimuli, however given bizarre presentation, patient appears to exhibit features of psychosis. Patient is denying suicidal, self-harm/homicidal ideation and denies any hallucination. Given the limited information patient is able to provide and the need to obtain collateral will admit patient for overnight observation for 24 hours to monitor current mental status.  Patient although reluctant is now agreeable to the plan. Total Time spent with patient: 30 minutes  Musculoskeletal  Strength & Muscle Tone: within normal limits Gait & Station: normal Patient leans: N/A  Psychiatric Specialty Exam  Presentation General Appearance: Disheveled  Eye Contact:Fair  Speech:Clear and Coherent  Speech Volume:Normal  Handedness:Right   Mood and Affect  Mood:Dysphoric  Affect:Appropriate   Thought Process  Thought Processes:Linear  Descriptions of Associations:Circumstantial  Orientation:Full (Time, Place and Person)  Thought Content:WDL    Hallucinations:Hallucinations: None  Ideas of Reference:None  Suicidal Thoughts:Suicidal Thoughts: No  Homicidal Thoughts:Homicidal  Thoughts: No   Sensorium  Memory:Immediate Fair; Recent Fair; Remote Fair  Judgment:Fair  Insight:Poor   Executive Functions   Concentration:Poor  Attention Span:Poor  Recall:Poor  Fund of Knowledge:Fair  Language:Fair   Psychomotor Activity  Psychomotor Activity:Psychomotor Activity: Normal   Assets  Assets:Communication Skills; Desire for Improvement; Financial Resources/Insurance   Sleep  Sleep:Sleep: Poor Number of Hours of Sleep: 0 (Patient will not disclose how much sleep he obtains)   Nutritional Assessment (For OBS and FBC admissions only) Has the patient had a weight loss or gain of 10 pounds or more in the last 3 months?: No Has the patient had a decrease in food intake/or appetite?: No Does the patient have dental problems?: No Does the patient have eating habits or behaviors that may be indicators of an eating disorder including binging or inducing vomiting?: No Has the patient recently lost weight without trying?: 0 Has the patient been eating poorly because of a decreased appetite?: 0 Malnutrition Screening Tool Score: 0    Physical Exam Constitutional:      Comments: Disheveled appearance  HENT:     Head: Normocephalic and atraumatic.  Eyes:     Extraocular Movements: Extraocular movements intact.     Conjunctiva/sclera: Conjunctivae normal.     Pupils: Pupils are equal, round, and reactive to light.  Cardiovascular:     Rate and Rhythm: Normal rate and regular rhythm.  Pulmonary:     Effort: Pulmonary effort is normal.     Breath sounds: Normal breath sounds.  Musculoskeletal:        General: Normal range of motion.     Cervical back: Normal range of motion.  Neurological:     General: No focal deficit present.     Mental Status: He is alert.    Review of Systems  Psychiatric/Behavioral:  The patient has insomnia.     Blood pressure (!) 144/114, pulse (!) 108, temperature 97.8 F (36.6 C), temperature source Oral, resp. rate 20, SpO2 100%. There is no height or weight on file to calculate BMI.  Past Psychiatric History: No past psychiatric history of provided     Is the patient at risk to self? No  Has the patient been a risk to self in the past 6 months? No .    Has the patient been a risk to self within the distant past? No   Is the patient a risk to others?  To be determined based on collateral information    Has the patient been a risk to others in the past 6 months? No   Has the patient been a risk to others within the distant past? No   Past Medical History: No past medical history reported   Family History: No family history provider   Social History:  Lives with mother. Moved to [place] from [place]  Last Labs:  No results found for any previous visit.    Allergies: Patient has no allergy information on record.  Medications:  Facility Ordered Medications  Medication   acetaminophen (TYLENOL) tablet 650 mg   alum & mag hydroxide-simeth (MAALOX/MYLANTA) 200-200-20 MG/5ML suspension 30 mL   magnesium hydroxide (MILK OF MAGNESIA) suspension 30 mL   hydrOXYzine (ATARAX) tablet 25 mg   traZODone (DESYREL) tablet 50 mg   OLANZapine zydis (ZYPREXA) disintegrating tablet 5 mg      Medical Decision Making  -Admit to continuous assessment unit while awaiting collateral. -Patient appears extremely paranoid, bizarre, and hypervigilant, concern for possible evolving psychosis.  -  Patient declines medication at present, however, will trial Olanzapine 5 mg BID (encouraged staff to offer medication) -PRN medication Hydroxyzine for anxiety and Trazodone for insomnia Chart Merge:  Received a critical value result of hemoglobin of 4.0 for patient name Lauris Serviss (MRN [id number]).  Patient was registered here at [facility] under the name listed on IVC which generated this current MRN [id number]. Lab called to notify writer of critical lab value 4.0 and they were able to reference patient's original MRN number [id number] which is now merged with current MRN number. Patient was subsequently sent to the [facility] for medical clearance due to critical labs  value. Of note per chart review of prior MRN patient has a history Sickle Cell anemia and has a history of sickle crisis.     Recommendations  Based on my evaluation the patient appears to have an emergency medical condition for which I recommend the patient be transferred to the emergency department for further evaluation.  Joaquin Courts, NP 10/04/23  12:05 PM

## 2023-10-04 NOTE — H&P (Signed)
Hospital Admission History and Physical Service Pager: [phone]  Patient name: Alfred Phillips Medical record number: [id number] Date of Birth: [birth year] Age: 37 y.o. Gender: male  Primary Care Provider: Pcp, No Consultants: Hematology, psychiatry Code Status: Full, patient does not have medical capacity to answer CODE STATUS Preferred Emergency Contact: Declines emergency contacts. Contact Information     Name Relation Home Work Mobile   [name] Mother [phone]        Other Contacts   None on File      Chief Complaint: Anemia, psychosis  Assessment and Plan: Alfred Phillips is a 37 y.o. male presenting with pancytopenia and psychosis. Differential for this patient's presentation of this includes lymphoma, leukemia, iron deficiency anemia, sickle cell process, malnutrition/vitamin deficiency, aplastic anemia, autoimmune disease, bone marrow infiltration/infectious etiology (neurosyphilis, HIV, hepatitis), bone marrow malignancy, illicit drug use, alcohol use disorder, thyroid disorder, paroxysmal nocturnal hemoglobinuria.  Patient's unique symptomatology and corresponding lab results are an interesting mix I do suspect at the very least there is multifactorial process explaining his presentation.  He does have history of sickle cell trait although this likely does not explain his presenting psychosis nor his profound pancytopenia.  He is profoundly iron deficient which may partially explain his severe anemia.  His reticulocytes are not responding as well.  I do not suspect he is having sickle cell crisis given lack of underlying symptomatology.  Thus far, his vitamin B12 is normal, likely ruling out a pernicious anemia picture.  Thyroid disorder ruled out as well given normal labs.  His alcohol use is unclear, he presents with alcohol level of 218 and history of last drink at least 24 hours ago.  However, and his current mental state, I am unable to verify this.  Autoimmune  disease and underlying malignancy (leukemia, bone marrow) cannot be ruled out however we will attempt to test for these.  Given his neutropenic state, unique infectious etiology should also be considered and should further include neurosyphilis, HIV, hepatitis.  Reassuringly CT head is unremarkable.   Assessment & Plan Pancytopenia (HCC) Reviewed prior lab work, hemoglobinopathy evaluation revealed hemoglobin S of 29.5% meeting criteria for heterozygous sickle cell trait.  Presents with hemoglobin 4.0, repeat 3.5.  Further leukopenic at 2.2, neutropenic 0.4, platelets 72, reduced absolute reticulocyte count 17.1.  Type and screen O+ and negative for Kell antigen.  Currently receiving PRBC transfusion of 2 units.  His iron labs suggest profound iron deficiency at the very least. -Admit to FMTS, med tele unit, attending Dr. Lum Babe -Hematology following, appreciate assistance -Post H&H 2 hours after transfusion, transfusion threshold<7 -Avoid excess transfusions given sickle cell status -AM CBC with differential, CMP, hepatitis panel, ANA, ESR, CRP, UA -Follow-up blood cultures, pathology smear -Follow-up RPR, HIV -Consider repeat hemoglobin electrophoresis -Ferrous sulfate 325 mg every other day -Neutropenic precautions for ANC<0.5 Psychosis, unspecified psychosis type (HCC) No history of previous psychiatric disorder, unaware of family history.  Status post Geodon x 1, improved mental state.  He was IVC'd on 10/9, verified in media tab, lasting 7 days. -Psychiatry following, appreciate recommendations -Flight risk, IV seed with 1: 1 continuous monitoring Alcohol use Alcohol level 218, no history of alcohol withdrawal.  Opted to not place Ativan orders given additional psychotic picture without further symptomatology that would be associated with alcohol withdrawal. -Multivitamin, thiamine, folic acid daily -CIWA's every 6 hours  Chronic and Stable Conditions: N/A  FEN/GI: Regular VTE  Prophylaxis: None  Disposition: Med/tele  History of Present Illness:  Alfred  Phillips is a 37 y.o. male presenting with manic behavior however failing preliminary exam at [facility].  Patient reportedly has had 3 days of manic behavior listening to loud music, having religious delusions which led to his neighbors calling the police at 3 AM.  [facility] to [facility] and was [facility]C however upon preliminary testing had a hemoglobin of 4.0.  He was noted to be very manic and psychotic.  Psychiatry was consulted and note the patient is IV seed however is critically medically ill requiring further medical care.  Hematology was consulted and per ED provider was "perplexed".  They plan on performing bone marrow biopsy tomorrow morning.  Patient received Geodon and has been more calm.  Of note, patient has a history of hemoglobin SS trait upon last screening.  ED provider attempted to call patient's family however no one picked up.  ED provider ordered PRBC transfusion of 2 units and patient was amenable to receiving this.  ED provider notes that he is also potentially homeless.  Upon discussion with patient, he states "I am straight".  He could recite his name and date of birth but upon further questioning got very upset at "stupid questions" being asked.  Denies pain of any nature, shortness of breath, weakness, abdominal pain.  He last drank alcohol yesterday, smokes when he feels like it, denied any further drug use.  He denied any known medical conditions or medications that he takes.  He wants water.  He declined to answer questions about his living situation stating "I am not gay".  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   Pertinent Past Surgical History: None Remainder reviewed in history tab.  Pertinent Social History: Tobacco use: Yes -unable to clarify amount Alcohol use: Yes  -unable to clarify amount Other Substance use: None Declined answering living situation  Pertinent Family History: None Remainder reviewed in history tab.   Important Outpatient Medications: None Remainder reviewed in medication history.   Objective: BP 101/62   Pulse 80   Temp 98.1 F (36.7 C) (Oral)   Resp 17   Ht 5\' 8"  (1.727 m)   Wt 63.5 kg   SpO2 100%   BMI 21.29 kg/m  Exam: General: Well-appearing, laying down in bed Eyes: Deferred ENTM: Deferred Neck: Deferred Cardiovascular: RRR, no murmurs auscultated Respiratory: Normal work of breathing Gastrointestinal: Deferred MSK: Deferred Derm: Deferred Neuro: Conversationally appropriate, alert to name and date of birth Psych: Angry at line of questions, disengaged  Labs:  CBC BMET  Recent Labs  Lab 10/04/23 1844  WBC 2.2*  HGB 3.5*  HCT 13.1*  PLT 72*   Recent Labs  Lab 10/04/23 1844  NA 146*  K 3.8  CL 111  CO2 22  BUN 6  CREATININE 0.95  GLUCOSE 86  CALCIUM 8.3*     POCT urine drug screening: Negative Alcohol: 218 Lipid panel: Normal TSH: Normal Hemoglobin A1c: 5.5% LDH: <70 Reticulocytes: Reticulocyte count 17.1 (low) Ferritin: 3 (low) Iron and TIBC: Iron 13 (low), saturation ratio 3% (low), TIBC 441 (normal) B12: Normal RVP: Negative  EKG: NSR with normal PR and QRS interval, occasional PVC noted, no ST changes appreciable  Imaging Studies Performed: CT head: Negative  Shelby Mattocks, DO 10/04/2023, 9:45 PM PGY-3, [facility]  FPTS Intern pager: [phone], text pages welcome Secure chat group [facility] Teaching Service

## 2023-10-04 NOTE — ED Notes (Signed)
EDP reported if pt was not cooperative once the medication gets in him, by 8 pm EDP is okay with using restraints if needed to start an IV/draw blood.

## 2023-10-04 NOTE — H&P (Incomplete)
Hospital Admission History and Physical Service Pager: [phone]  Patient name: Alfred Phillips Medical record number: [id number] Date of Birth: 1986-02-02 Age: 37 y.o. Gender: male  Primary Care Provider: Pcp, No Consultants: Hematology, psychiatry Code Status: Full, patient does not have medical capacity to answer CODE STATUS Preferred Emergency Contact: Declines emergency contacts. Contact Information     Name Relation Home Work Mobile   Alfred Phillips Mother [phone]        Other Contacts   None on File      Chief Complaint: Anemia, psychosis  Assessment and Plan: Alfred Phillips is a 37 y.o. male presenting with pancytopenia and psychosis. Differential for this patient's presentation of this includes ***.  (***describe here why less likely than primary diagnosis-delete this once complete***)  Assessment & Plan Pancytopenia (HCC) Reviewed prior lab work, hemoglobinopathy evaluation revealed hemoglobin S of 29.5% meeting criteria for heterozygous sickle cell trait.  Presents with hemoglobin 4.0, repeat 3.5.  Further leukopenic at 2.2, neutropenic 0.4, platelets 72, reduced absolute reticulocyte count 17.1.  Type and screen O+ and negative for Kell antigen.  Currently receiving PRBC transfusion of 2 units. -Admit to FM TS, med tele unit, attending Dr. Lum Babe -Hematology following, appreciate assistance - Psychosis, unspecified psychosis type (HCC)  Alcohol use     Chronic and Stable Conditions: N/A  FEN/GI: Regular VTE Prophylaxis: None  Disposition: ***  History of Present Illness:  Alfred Phillips is a 37 y.o. male presenting with manic behavior however failing preliminary exam at [facility].  Patient reportedly has had 3 days of manic behavior listening to loud music, having religious delusions which led to his neighbors calling the police at 3 AM.  [facility] to [facility] and was [facility] however upon preliminary testing had a hemoglobin of 4.0.  He was noted to be  very manic and psychotic.  Psychiatry was consulted and note the patient is IV seed however is critically medically ill requiring further medical care.  Hematology was consulted and per ED provider was "perplexed".  They plan on performing bone marrow biopsy tomorrow morning.  Patient received Geodon and has been more calm.  Of note, patient has a history of hemoglobin SS trait upon last screening.  ED provider attempted to call patient's family however no one picked up.  ED provider ordered PRBC transfusion of 2 units and patient was amenable to receiving this.  ED provider notes that he is also potentially homeless.  Upon discussion with patient, he states "I am straight".  He could recite his name and date of birth but upon further questioning got very upset at stupid questions being asked.  Denies pain of any nature, shortness of breath, weakness, abdominal pain.  He last drank alcohol yesterday, smokes when he feels like it, denied any further drug use.  He denied any known medical conditions or medications that he takes.  He wants water.  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   Pertinent Past Surgical History: None Remainder reviewed in history tab.  Pertinent Social History: Tobacco use: Yes -unable to clarify amount Alcohol use: Yes -unable to clarify amount Other Substance use: None Declined answering living situation  Pertinent Family History: None Remainder reviewed in history tab.   Important Outpatient Medications:   Remainder reviewed in medication history.   Objective: BP 101/62   Pulse 80   Temp 98.1 F (  36.7 C) (Oral)   Resp 17   Ht 5\' 8"  (1.727 m)   Wt 63.5 kg   SpO2 100%   BMI 21.29 kg/m  Exam: General: *** Eyes: *** ENTM: *** Neck: *** Cardiovascular: *** Respiratory: *** Gastrointestinal: *** MSK: *** Derm:  *** Neuro: *** Psych: ***  Labs:  CBC BMET  Recent Labs  Lab 10/04/23 1844  WBC 2.2*  HGB 3.5*  HCT 13.1*  PLT 72*   Recent Labs  Lab 10/04/23 1844  NA 146*  K 3.8  CL 111  CO2 22  BUN 6  CREATININE 0.95  GLUCOSE 86  CALCIUM 8.3*     POCT urine drug screening: Negative Alcohol: 218 Lipid panel: Normal TSH: Normal Hemoglobin A1c: 5.5% LDH: <70 Reticulocytes: Reticulocyte count 17.1 (low) Ferritin: 3 (low) Iron and TIBC: Iron 13 (low), saturation ratio 3% (low), TIBC 441 (normal)  EKG: NSR with normal PR and QRS interval, occasional PVC noted, no ST changes appreciable   Imaging Studies Performed: CT head: Negative  Alfred Mattocks, DO 10/04/2023, 9:45 PM PGY-3, [facility]  FPTS Intern pager: [phone], text pages welcome Secure chat group [facility] Teaching Service

## 2023-10-04 NOTE — ED Provider Notes (Signed)
Patient unable will to be evaluated at this time for consultation, appreciably received Geodon due to active psychosis around 1700.    Patient is additionally medically unstable, appreciably transferred from [facility] for hemoglobin of 4 and still requires at this time extensive medical care.

## 2023-10-04 NOTE — ED Notes (Signed)
Patient Hgb is 4.0 per the lab, patient is being transported to [facility] for medical clearance. [employer] notified

## 2023-10-04 NOTE — Discharge Instructions (Addendum)
Accepted by Dr. Sandra Cockayne to [facility]

## 2023-10-04 NOTE — ED Notes (Signed)
Waiting on Magistrate

## 2023-10-04 NOTE — Progress Notes (Signed)
   10/04/23 0930  [facility] Triage Screening (Walk-ins at [facility] only)  How Did You Hear About [place]? Legal System  What Is the Reason for Your Visit/Call Today? Alfred Phillips is a 37 year old male presenting to [facility] escorted by [employer], under an IVC order. Per the IVC, the pt "rambles" when he speaks, making nonsensical statements and that he is aggressive towards family. During triage, pt denies drinking and smoking cannabis. However, according to the IVC, the petitioner mentions that the pt smokes and drinks daily. Upon assessment, pt states, "some chick called the cops and said I was unstable". Pt is unable to elaborate further as to why he is here. Pt curently denies substance use, SI, HI and AVH.  How Long Has This Been Causing You Problems? <Week  Have You Recently Had Any Thoughts About Hurting Yourself? No  Are You Planning to Commit Suicide/Harm Yourself At This time? No  Have you Recently Had Thoughts About Hurting Someone Karolee Ohs? No  Are You Planning To Harm Someone At This Time? No  Are you currently experiencing any auditory, visual or other hallucinations? No  Have You Used Any Alcohol or Drugs in the Past 24 Hours? No  Do you have any current medical co-morbidities that require immediate attention? No  Clinician description of patient physical appearance/behavior: rambling, nonsensical statements  What Do You Feel Would Help You the Most Today? Medication(s)  If access to [facility] was not available, would you have sought care in the Emergency Department? No  Determination of Need Urgent (48 hours)  Options For Referral Medication Management;Inpatient Hospitalization

## 2023-10-04 NOTE — ED Notes (Signed)
Pt refusing blood draw & IM meds ordered, security called to assist to give IM Geodon.

## 2023-10-04 NOTE — ED Notes (Signed)
I gave  Dr. Doran Durand first Exam 3pm

## 2023-10-04 NOTE — ED Provider Notes (Signed)
[place] EMERGENCY DEPARTMENT AT [facility] Provider Note   CSN: [id number] Arrival date & time: 10/04/23  1451  History  Chief Complaint  Patient presents with   Low HGB   BHUC Tx    Dames Rennaker is a 37 y.o. male.  HPI This is a 37 year old male with unknown medical history brought in by the EMS from his behavioral health center because his neighbors called and complained that he had been playing loud music  for the past couple of days and found to have a hemoglobin of 4.  Patient however denies any shortness of breath, chest pains, fatigue at this time.  Home Medications Prior to Admission medications   Not on File      Allergies    Patient has no known allergies.    Review of Systems   Review of Systems  Physical Exam Updated Vital Signs BP 113/65 (BP Location: Right Arm)   Pulse 98   Temp 98.5 F (36.9 C) (Oral)   Resp 17   Ht 5\' 8"  (1.727 m)   Wt 63.5 kg   SpO2 100%   BMI 21.29 kg/m  Physical Exam Constitutional:      General: He is not in acute distress.    Appearance: Normal appearance. He is ill-appearing. He is not toxic-appearing or diaphoretic.  HENT:     Head: Normocephalic and atraumatic.  Cardiovascular:     Rate and Rhythm: Normal rate and regular rhythm.  Pulmonary:     Effort: Pulmonary effort is normal.  Abdominal:     General: Abdomen is flat.  Skin:    General: Skin is warm and dry.  Neurological:     Mental Status: He is alert.     Comments: Anxious speech but goal oriented     ED Results / Procedures / Treatments   Labs (all labs ordered are listed, but only abnormal results are displayed) Labs Reviewed  RESPIRATORY PANEL BY PCR  CBC  LACTATE DEHYDROGENASE  COMPREHENSIVE METABOLIC PANEL  RAPID URINE DRUG SCREEN, HOSP PERFORMED  RETICULOCYTES  TYPE AND SCREEN  PREPARE RBC (CROSSMATCH)    EKG None  Radiology No results found.  Procedures Procedures    Medications Ordered in ED Medications  0.9 %   sodium chloride infusion (Manually program via Guardrails IV Fluids) (has no administration in time range)  ziprasidone (GEODON) injection 20 mg (20 mg Intramuscular Given 10/04/23 1717)  sterile water (preservative free) injection (  Given 10/04/23 1718)    ED Course/ Medical Decision Making/ A&P Clinical Course as of 10/04/23 1845  Wed Oct 04, 2023  1802 Tried to approach about need for lab work. Told me to "fuck off" and that if anyone "comes at me for blood, I'm gonna fuck them up." [CC]    Clinical Course User Index [CC] Glyn Ade, MD                                 Medical Decision Making Amount and/or Complexity of Data Reviewed Labs: ordered. Radiology: ordered.  Risk Prescription drug management.   This patient is a 37 y.o. male who presents to the ED for concern of abnormal behavior and found to have Hgb of 4 en route to the ED , this involves an extensive number of treatment options, and is a complaint that carries with it a high risk of complications and morbidity. The emergent differential diagnosis prior to evaluation includes,  but is not limited to Anemia 2/2 aplastic crises vs sickling vs bleeding. This is not an exhaustive differential. Patient is not complaint to a rectal exam at this time to rule in GI bleed.  Patient does not seem to have capacity to make decisions at this time.  Plan is to call his mother  for consent to transfuse the patient and possibly admit for heme-onc follow-up.  Repeat CBC showed a hemoglobin of 3.5 with a platelet of 1.4.  Past Medical History / Co-morbidities / Social History: Unknown   Physical Exam: Physical exam performed. The pertinent findings include paranoia, rushed speech but goal oriented  Lab Tests: Patient is  agitated and not willing for labs to be drawn at this time   Medications: I ordered medication including Geodon for patient. Reevaluation of the patient after these medicines showed that the patient improved. I  have reviewed the patients home medicines and have made adjustments as needed.  Consultations Obtained: I requested consultation with the on-call psych attending,  and discussed lab and imaging findings as well as pertinent plan - they recommend involuntarily admitting the patient.  I also consulted Dr. Leonides Schanz of hematology and oncology who recommended getting the B12 and iron levels and also a CT head. He would see the patient for further evaluation   Disposition: After consideration of the diagnostic results and the patients response to treatment, I feel that the emergency department workup  suggest an emergent condition requiring admission or immediate intervention beyond what has been performed at this time. The plan is to involuntarily admit the patient and have hematology see the patient for further workup.   I discussed this case with my attending physician Dr Doran Durand who cosigned this note including patient's presenting symptoms, physical exam, and planned diagnostics and interventions. Attending physician stated agreement with plan or made changes to plan which were implemented.           Final Clinical Impression(s) / ED Diagnoses Final diagnoses:  None    Rx / DC Orders ED Discharge Orders     None         Kathleen Lime, MD 10/04/23 2143

## 2023-10-04 NOTE — ED Provider Notes (Addendum)
I evaluated patient at bedside.  He was sent over from behavioral urgent care for labs showing 3X cell lines down. Per the patient, his neighbors only called the cops because they do not like him, per the police he has been up playing music all night long for multiple nights in a row which is why he was IVC by the neighbors.  Patient endorses agreement with this history that he has indeed not been sleeping and has been staying up most nights.  He denies any psychiatric history and his chart initially is very little information. When I ask him about his medical history, he cannot answer the question states that he is "straight" and repeats this line multiple times.  When asked about any medical problems he just responds the same way.  I informed him that he was found to be severely anemic at the behavioral [facility] and he stated "I cannot die" and "I am of God" Clearly patient does not have capacity or understanding of either his disease process for his situation.  Given the history of lack of sleep, he is likely suffering from a psychosis episode.  Uncertain underlying etiology given very limited history.  Will workup for underlying pancytopenia (anticipate this can be aplastic crisis in the setting of sickle cell with medication noncompliance).  First exam completed.  Patient likely to require transfusion, will discuss with him further after he has been medicated to see if he is able to adequately understand and consent for blood transfusion.   Glyn Ade, MD 10/04/23 1542  Update: Patient observed in emergency room for 3 and half hours given report of intoxication.  He is refusing to allow any further care.  IVC paperwork completed as above. I consulted the local psychiatric team based upon his presentation.  They agree with description of psychosis and need for medical management for stabilization. I called the patient's mother and patient's family member using his merged chart and there  was no answer on any of the phone numbers.  HIPAA compliant voicemail left for mother. Patient has a critical illness in the form of severe anemia.  This is relatively profound at hemoglobin of 4.  Given his own lack of capacity, I believe is in the patient's best interest to proceed with treatment.  In the past he has consented to transfusion treatments without expressing any refusal. Given that he is actively psychotic, we will proceed with treatment in patient's best interest and arrange for admission to medicine. See resident attestation for more information today.   Glyn Ade, MD 10/04/23 1820

## 2023-10-04 NOTE — ED Notes (Signed)
IVC paper work attached to clipboard in blue zone 3 copies, one red folder, one in Medical records drawer.

## 2023-10-04 NOTE — ED Notes (Signed)
#  [id number] envelope number

## 2023-10-04 NOTE — ED Triage Notes (Addendum)
Pt BIB [facility] from [facility] d/t his labs showing hgb of 4, is IVC'd. VSS, A/Ox4, pt denies pain or any other symptoms. EMS reports Dr. Iona Beard was the provider he was informed had pt transferred to ED. Pt does appear pale & refused PIV while en route to ED. EMS reports they were informed pt has Hx of Sickle cell but pt is unaware that he had that.

## 2023-10-04 NOTE — ED Notes (Signed)
Received  a call from [facility] at [facility] lab with a critical report of abs Neutro phill of 0.4. Message to provider to alert. Patient is awaiting transfer to [facility] ED.

## 2023-10-04 NOTE — ED Notes (Signed)
Pt refused to take the UA cup when he went to the restroom before Geodon was given.

## 2023-10-04 NOTE — ED Notes (Signed)
Witnessed a critical lab report of a HGB of 4.0. Message to provider to alert.

## 2023-10-04 NOTE — ED Provider Notes (Signed)
Critical lab received from RN advising of hemoglobin 4. Cross referenced  prior [id number], patient has history of sickle cell anemia  Hb SS And has been hospitalized previously due to crisis.  Joaquin Courts, NP

## 2023-10-04 NOTE — ED Notes (Signed)
Patient brought on unit, locked shorts into locker, skin assessment WDL. Oriented pt to unit.

## 2023-10-05 ENCOUNTER — Encounter (HOSPITAL_COMMUNITY): Payer: Self-pay | Admitting: Student

## 2023-10-05 ENCOUNTER — Inpatient Hospital Stay (HOSPITAL_COMMUNITY): Payer: 59

## 2023-10-05 DIAGNOSIS — F29 Unspecified psychosis not due to a substance or known physiological condition: Secondary | ICD-10-CM

## 2023-10-05 DIAGNOSIS — Z789 Other specified health status: Secondary | ICD-10-CM

## 2023-10-05 DIAGNOSIS — D649 Anemia, unspecified: Secondary | ICD-10-CM | POA: Diagnosis not present

## 2023-10-05 LAB — COMPREHENSIVE METABOLIC PANEL
ALT: 10 U/L (ref 0–44)
AST: 18 U/L (ref 15–41)
Albumin: 3.2 g/dL — ABNORMAL LOW (ref 3.5–5.0)
Alkaline Phosphatase: 35 U/L — ABNORMAL LOW (ref 38–126)
Anion gap: 10 (ref 5–15)
BUN: 6 mg/dL (ref 6–20)
CO2: 23 mmol/L (ref 22–32)
Calcium: 8.4 mg/dL — ABNORMAL LOW (ref 8.9–10.3)
Chloride: 108 mmol/L (ref 98–111)
Creatinine, Ser: 0.79 mg/dL (ref 0.61–1.24)
GFR, Estimated: >60 mL/min (ref >60)
Glucose, Bld: 76 mg/dL (ref 70–99)
Potassium: 3.9 mmol/L (ref 3.5–5.1)
Sodium: 141 mmol/L (ref 135–145)
Total Bilirubin: 0.7 mg/dL (ref 0.3–1.2)
Total Protein: 5.6 g/dL — ABNORMAL LOW (ref 6.5–8.1)

## 2023-10-05 LAB — PREPARE RBC (CROSSMATCH)

## 2023-10-05 LAB — URINALYSIS, ROUTINE W REFLEX MICROSCOPIC
Bilirubin Urine: NEGATIVE
Glucose, UA: NEGATIVE mg/dL
Hgb urine dipstick: NEGATIVE
Ketones, ur: NEGATIVE mg/dL
Leukocytes,Ua: NEGATIVE
Nitrite: NEGATIVE
Protein, ur: NEGATIVE mg/dL
Specific Gravity, Urine: 1.016 (ref 1.005–1.030)
pH: 7 (ref 5.0–8.0)

## 2023-10-05 LAB — SARS CORONAVIRUS 2 BY RT PCR: SARS Coronavirus 2 by RT PCR: NEGATIVE

## 2023-10-05 LAB — CBC WITH DIFFERENTIAL/PLATELET
Abs Immature Granulocytes: 0 10*3/uL (ref 0.00–0.07)
Basophils Absolute: 0 10*3/uL (ref 0.0–0.1)
Basophils Relative: 0 %
Eosinophils Absolute: 0.1 10*3/uL (ref 0.0–0.5)
Eosinophils Relative: 2 %
HCT: 18.4 % — ABNORMAL LOW (ref 39.0–52.0)
Hemoglobin: 5.3 g/dL — CL (ref 13.0–17.0)
Lymphocytes Relative: 47 %
Lymphs Abs: 1.4 10*3/uL (ref 0.7–4.0)
MCH: 20.4 pg — ABNORMAL LOW (ref 26.0–34.0)
MCHC: 28.8 g/dL — ABNORMAL LOW (ref 30.0–36.0)
MCV: 70.8 fL — ABNORMAL LOW (ref 80.0–100.0)
Monocytes Absolute: 0 10*3/uL — ABNORMAL LOW (ref 0.1–1.0)
Monocytes Relative: 1 %
Neutro Abs: 1.5 10*3/uL — ABNORMAL LOW (ref 1.7–7.7)
Neutrophils Relative %: 50 %
Platelets: 58 10*3/uL — ABNORMAL LOW (ref 150–400)
RBC: 2.6 MIL/uL — ABNORMAL LOW (ref 4.22–5.81)
RDW: 29.7 % — ABNORMAL HIGH (ref 11.5–15.5)
WBC: 3 10*3/uL — ABNORMAL LOW (ref 4.0–10.5)
nRBC: 0.7 % — ABNORMAL HIGH (ref 0.0–0.2)
nRBC: 1 /100{WBCs} — ABNORMAL HIGH

## 2023-10-05 LAB — APTT: aPTT: 28 s (ref 24–36)

## 2023-10-05 LAB — C-REACTIVE PROTEIN: CRP: 0.6 mg/dL (ref <1.0)

## 2023-10-05 LAB — SEDIMENTATION RATE: Sed Rate: 3 mm/h (ref 0–16)

## 2023-10-05 LAB — D-DIMER, QUANTITATIVE: D-Dimer, Quant: 0.31 ug{FEU}/mL (ref 0.00–0.50)

## 2023-10-05 LAB — RPR: RPR Ser Ql: NONREACTIVE

## 2023-10-05 LAB — HEPATITIS PANEL, ACUTE
HCV Ab: NONREACTIVE
Hep A IgM: NONREACTIVE
Hep B C IgM: NONREACTIVE
Hepatitis B Surface Ag: NONREACTIVE

## 2023-10-05 LAB — PROTIME-INR
INR: 1.3 — ABNORMAL HIGH (ref 0.8–1.2)
Prothrombin Time: 16 s — ABNORMAL HIGH (ref 11.4–15.2)

## 2023-10-05 LAB — PATHOLOGIST SMEAR REVIEW

## 2023-10-05 LAB — HEMOGLOBIN AND HEMATOCRIT, BLOOD
HCT: 23.3 % — ABNORMAL LOW (ref 39.0–52.0)
HCT: 24.9 % — ABNORMAL LOW (ref 39.0–52.0)
Hemoglobin: 6.8 g/dL — CL (ref 13.0–17.0)
Hemoglobin: 7.5 g/dL — ABNORMAL LOW (ref 13.0–17.0)

## 2023-10-05 LAB — URIC ACID: Uric Acid, Serum: 5.8 mg/dL (ref 3.7–8.6)

## 2023-10-05 LAB — HIV ANTIBODY (ROUTINE TESTING W REFLEX): HIV Screen 4th Generation wRfx: NONREACTIVE

## 2023-10-05 MED ORDER — SODIUM CHLORIDE 0.9% IV SOLUTION: Freq: Once | INTRAVENOUS | Status: AC

## 2023-10-05 MED ORDER — FERROUS SULFATE 325 (65 FE) MG PO TABS
325.0000 mg | ORAL_TABLET | ORAL | Status: DC
  Administered 2023-10-07 – 2023-10-11 (×3): 325 mg via ORAL
  Filled 2023-10-05 (×5): qty 1

## 2023-10-05 MED ORDER — SODIUM CHLORIDE 0.9 % IV SOLN
510.0000 mg | Freq: Once | INTRAVENOUS | Status: AC
  Administered 2023-10-05: 510 mg via INTRAVENOUS
  Filled 2023-10-05: qty 17

## 2023-10-05 NOTE — Assessment & Plan Note (Signed)
Alcohol level 218, no history of alcohol withdrawal.  Opted to not place Ativan orders given additional psychotic picture without further symptomatology that would be associated with alcohol withdrawal. -Multivitamin, thiamine, folic acid daily -CIWA's every 6 hours

## 2023-10-05 NOTE — Assessment & Plan Note (Addendum)
Patient has no documented psychiatric history.  While patient was positive for alcohol, UDS was negative in the setting of: Lack of sleep, delusional thinking, paranoia, reddening to kill his family by poisoning their food, rambling, disorganized speech.  Per IVC report by patient's mother, patient uses cannabis and marijuana daily.  Patient has had no previous psychiatric diagnosis, history, or medication trials.  On interview at [facility]: Patient appeared to be "extremely paranoid, bizarre, hypervigilant".  On exam today patient was flat, guarded, minimally cooperative with interview/exam, asking when he could leave. Did not appear to be delusional or RTIS, however exam was limited by participation.  Denies that he has any medical or psychiatric problems, and insists that he is healthy. - Patient is under active IVC orders from 10/9 to 10/16.  Will need to be renewed by then, if necessary. - 1:1 continuous monitoring ordered - Psychiatry service consulted, will see tomorrow morning.  Would very much appreciate their considered recommendations in this complicated patient.

## 2023-10-05 NOTE — Progress Notes (Addendum)
This CSW will now remove pt from the TTS/ BH shift report. Pt will be medically admitted due to low hemoglobin. Inpatient Psych consult team will follow for psych needs.   Maryjean Ka, MSW, [facility] 10/05/2023 8:13 AM

## 2023-10-05 NOTE — ED Notes (Signed)
IVC case number: [id number] Exp:10-11-23

## 2023-10-05 NOTE — Consult Note (Signed)
Hematology/Oncology Consult Note  Clinical Summary: Alfred Phillips is a 37 year old male with medical history significant for mental illness who presents with severe iron deficiency anemia and thrombocytopenia.  Reason for Consult: Severe iron deficiency anemia, neutropenia and thrombocytopenia  HPI: Alfred Phillips is a 37 year old male with medical history significant for sickle cell trait who was brought to the emergency department involuntarily by the police department after police called his neighbors over loud music for several days.  On review of prior records Alfred Phillips had 3 days of manic behavior listening to loud music and having religious delusions.  His neighbors called the police who issued an IVC and had him brought to the [facility] emergency department.  In the emergency department on 10/04/2023 patient had white blood cell count 1.7, hemoglobin 4.0, MCV 64.5, and platelets of 94.  His ANC was 0.4.  Due to concern for his labs he was admitted for further evaluation and management.  Of note patient has not been reliably providing information and does appear to be having a psychotic episode.  On exam today Alfred Phillips is accompanied by hospital approved sitter.  He reports " nothing is wrong with me".  He reports that he has never had any major issues with his blood but when pressed he reports as a child he did receive a blood transfusion.  He notes that he is unaware of having sickle cell trait.  He reports that he has not had any overt signs of bleeding such as nosebleeds, gum bleeding, or dark stools.  He reports overall he feels fine with no lightheadedness, dizziness, or shortness of breath.  He is very eager to be discharged.  When we discussed the severity of his anemia he reports that he is "fine".  He otherwise denies any fevers, chills, sweats, nausea, vomiting or diarrhea.  Full 10 point ROS was otherwise negative.  O:  Vitals:   10/05/23 1402 10/05/23 1544  BP: 122/81 109/65   Pulse: (!) 55 (!) 59  Resp: 16 19  Temp: 98.1 F (36.7 C) 98.5 F (36.9 C)  SpO2: 100% 100%      Latest Ref Rng & Units 10/05/2023    2:33 AM 10/04/2023    6:44 PM 10/04/2023   11:39 AM  CMP  Glucose 70 - 99 mg/dL 76  86  84   BUN 6 - 20 mg/dL 6  6  6    Creatinine 0.61 - 1.24 mg/dL 4.09  8.11  9.14   Sodium 135 - 145 mmol/L 141  146  145   Potassium 3.5 - 5.1 mmol/L 3.9  3.8  4.0   Chloride 98 - 111 mmol/L 108  111  110   CO2 22 - 32 mmol/L 23  22  24    Calcium 8.9 - 10.3 mg/dL 8.4  8.3  8.6   Total Protein 6.5 - 8.1 g/dL 5.6  5.8  6.8   Total Bilirubin 0.3 - 1.2 mg/dL 0.7  0.3  0.5   Alkaline Phos 38 - 126 U/L 35  37  41   AST 15 - 41 U/L 18  16  20    ALT 0 - 44 U/L 10  11  12        Latest Ref Rng & Units 10/05/2023    8:13 AM 10/05/2023    2:33 AM 10/04/2023    6:44 PM  CBC  WBC 4.0 - 10.5 K/uL  3.0  2.2   Hemoglobin 13.0 - 17.0 g/dL 6.8  5.3  3.5   Hematocrit 39.0 - 52.0 % 23.3  18.4  13.1   Platelets 150 - 400 K/uL  58  72       GENERAL: well appearing middle-aged African-American male in NAD  SKIN: skin color, texture, turgor are normal, no rashes or significant lesions EYES: conjunctiva are pink and non-injected, sclera clear OROPHARYNX: no exudate, no erythema; lips, buccal mucosa, and tongue normal  HEART: regular rate & rhythm and no murmurs and no lower extremity edema Musculoskeletal: no cyanosis of digits and no clubbing  PSYCH: alert & oriented x 3, fluent speech NEURO: no focal motor/sensory deficits  Assessment/Plan:  # Severe Iron Deficiency Anemia # Thrombocytopenia # Neutropenia -- Initial labs are concerning for severe iron deficiency anemia with iron sat of 3% with ferritin of 3 -- Recommend transfusion until patient's hemoglobin is greater than 7.0. -- No overt signs of bleeding, would recommend consultation with gastroenterology once patient is stable from a hematological and psychological standpoint. -- No evidence of hemolysis with  normal LDH.  Reticulocytes are suppressed, however this may be secondary to iron deficiency. -- Severe iron deficiency can cause thrombocytopenia and neutropenia, though if this is not resolved with IV iron infusion will look for alternative etiologies. -- Low suspicion that his findings are related to his sickle cell trait, no clear evidence of a sickle cell crisis. -- CT scan of the head shows no evidence of intracranial bleed, stroke, or other clear etiology for his altered mental status -- Hematology service will continue to follow.   Ulysees Barns, MD Department of Hematology/Oncology [facility] at [facility] Phone: [phone] Pager: [phone] Email: [email or web address]

## 2023-10-05 NOTE — Assessment & Plan Note (Signed)
No history of previous psychiatric disorder, unaware of family history.  Status post Geodon x 1, improved mental state.  He was IVC'd on 10/9, verified in media tab, lasting 7 days. -Psychiatry following, appreciate recommendations -Flight risk, IV seed with 1: 1 continuous monitoring

## 2023-10-05 NOTE — ED Notes (Signed)
IV team at bedside 

## 2023-10-05 NOTE — Progress Notes (Signed)
Daily Progress Note Intern Pager: [phone]  Patient name: Alfred Phillips Medical record number: [id number] Date of birth: 09/02/1986 Age: 37 y.o. Gender: male  Primary Care Provider: Pcp, No Consultants: Hematology, psychiatry Code Status: Full  Pt Overview and Major Events to Date:   Alfred Phillips is a 37 year old male with a history of sickle cell trait and poor medical follow-up who presents to the [facility] emergency department under IVC for psychosis, manic symptoms, and pancytopenia.  Per chart review, he was playing very loud music, staying up all night, saying that he was "a God and the devil". EtOH: 218.  UDS negative.  Received IM Geodon 20 mg as needed x1 for agitation and found to have a hemoglobin of 4.0 ? 3.5 on admission to the [facility]. CBC and iron labs indicate iron deficiency anemia. Now s/p 4u pRBC. Notably, in 2020, patient was admitted to the [facility] emergency department with a hemoglobin of 5.5 found to be similarly iron deficient.  Currently undergoing AMS/anemia workup. Assessment & Plan Pancytopenia (HCC) Hemoglobin 3.5 ? 5.3 ? 6.8 s/p 3 units pRBC.  Another unit given in a.m. 10/10.  Patient refusing further labs.  Leading differential diagnosis at this time include: Bone marrow malignancy (lymphoma versus leukemia), vitamin deficiency, viral process in the setting of immunodeficiency, myelodysplastic syndrome, paroxysmal nocturnal hemoglobinuria.  Microcytic anemia is expected in the setting of severe iron deficiency, however this does not explain his thrombocytopenia, leukopenia. Further, we do not believe that sickle cell trait is sufficient to explain his current presentation.  Reasonable suspicion for occult blood loss. - Ordered PO iron 325 mg, multivitamin, folic acid, thiamine: Patient refused all of these 10/10. - CBC/CMP ordered for a.m. of 10/11.  We will discuss further options if patient continues to refuse labs. - +/- combined systolic/diastolic murmur (flow  murmur?)  We will recheck tomorrow. - Hematology consult placed, appreciate recommendations.  Bone marrow biopsy? - CBC with differential: +Tear drop cells, + polychromasia, +target cells.  Reticulocyte count inappropriately low. - Hepatitis/HIV/RPR negative - Sed rate/CRP within normal limits. - Vitamin B12 within normal limits - Awaiting ANA with reflex  Psychosis, unspecified psychosis type [facility]) Patient has no documented psychiatric history.  While patient was positive for alcohol, UDS was negative in the setting of: Lack of sleep, delusional thinking, paranoia, reddening to kill his family by poisoning their food, rambling, disorganized speech.  Per IVC report by patient's mother, patient uses cannabis and marijuana daily.  Patient has had no previous psychiatric diagnosis, history, or medication trials.  On interview at [facility]: Patient appeared to be "extremely paranoid, bizarre, hypervigilant".  On exam today patient was flat, guarded, minimally cooperative with interview/exam, asking when he could leave. Did not appear to be delusional or RTIS, however exam was limited by participation.  Denies that he has any medical or psychiatric problems, and insists that he is healthy. - Patient is under active IVC orders from 10/9 to 10/16.  Will need to be renewed by then, if necessary. - 1:1 continuous monitoring ordered - Psychiatry service consulted, will see tomorrow morning.  Would very much appreciate their considered recommendations in this complicated patient. Alcohol use EtOH: 218.  Per patient, he had not been drinking for several days before admission to the hospital.  Does not appear to be undergoing acute withdrawal.  CIWAs have been 0 thus far.  We will continue for the next 24 to 48 hours. - Continue CIWA protocol every 6 hours - Patient refusing thiamine supplementation  as above  Chronic and Stable Issues: Sickle cell trait: Diagnosed in 2020.  Patient had no notion that he had this  disorder, appears to have been asymptomatic for most of his life.  FEN/GI: Regular PPx: SCDs Dispo: To be determined  Subjective:   On interview, patient is laying in hospital bed with sitter in room.  Guarded, flat mood.  Patient denied being ill: "I am not here because I am sick.  I am here because the police brought me here."  Asked Clinical research associate when he could go home.  Denies past medical history, past surgical history, previous medications, or relevant family history.  Permitted limited physical exam.  Objective:  BP: 110/69 HR: 69 RR: 14 T: Afebrile O2sat: 100% on room air  Significant vitals over past 24 hours:   Physical Exam:  General: Guarded, laying in emergency room bed, no acute distress Cardiovascular: Regular rate and rhythm, no rubs or gallops.  +/- Combined systolic/diastolic murmur Respiratory: Unable to assess Abdomen: Unable to assess Extremities: Unable to assess  Basic labs:  Most recent CBC Lab Results  Component Value Date   WBC 3.0 (L) 10/05/2023   HGB 5.3 (LL) 10/05/2023   HCT 18.4 (L) 10/05/2023   MCV 70.8 (L) 10/05/2023   PLT 58 (L) 10/05/2023   Most recent BMP    Latest Ref Rng & Units 10/05/2023    2:33 AM  BMP  Glucose 70 - 99 mg/dL 76   BUN 6 - 20 mg/dL 6   Creatinine 6.96 - 2.95 mg/dL 2.84   Sodium 132 - 440 mmol/L 141   Potassium 3.5 - 5.1 mmol/L 3.9   Chloride 98 - 111 mmol/L 108   CO2 22 - 32 mmol/L 23   Calcium 8.9 - 10.3 mg/dL 8.4     Other pertinent labs:  UA: Negative Hepatitis panel: Negative Sed rate: 3 WNL CRP: 0.6 UDS: Negative APTT: WNL CMP: Unremarkable D-dimer: negative Hgb: 4.0 ? 3.5 ? 5.3? 6.8 White blood cell: 1.7 ? 2.2 ? 3.0 MCV: 64.5 ? 66.2 ? 70.8 RBC: 2.6 Absolute reticulocyte count is 17.1 L Iron 13 L Ferritin 3 TIBC 441  Platelets: 58 Blood smear: Unremarkable   Awaiting: AP TTE ANA  Uric acid H&H: 6.8   Imaging/Diagnostic Tests:  Chest x-ray (10/10): No active disease  EKG: Sinus w/  PVCs, qtc: 446  Tomie China, MD 10/05/2023, 7:33 AM  PGY-1, [facility] FPTS Intern pager: [phone], text pages welcome Secure chat group [facility] Teaching Service

## 2023-10-05 NOTE — ED Notes (Signed)
ED TO INPATIENT HANDOFF REPORT  ED Nurse Name and Phone #: Dot Lanes, paramedic  S Name/Age/Gender Alfred Phillips 37 y.o. male Room/Bed: 031C/031C  Code Status   Code Status: Full Code  Home/SNF/Other BH Patient oriented to: self, place, time, and situation Is this baseline? Yes   Triage Complete: Triage complete  Chief Complaint Anemia of unknown etiology [D64.9]  Triage Note Pt BIB [facility] from [facility] d/t his labs showing hgb of 4, is IVC'd. VSS, A/Ox4, pt denies pain or any other symptoms. EMS reports Dr. Iona Beard was the provider he was informed had pt transferred to ED. Pt does appear pale & refused PIV while en route to ED. EMS reports they were informed pt has Hx of Sickle cell but pt is unaware that he had that.   Allergies No Known Allergies  Level of Care/Admitting Diagnosis ED Disposition     ED Disposition  Admit   Condition  --   Comment  Hospital Area: [facility] [[id number]]  Level of Care: Telemetry Medical [[id number]]  May admit patient to [name] or [facility] if equivalent level of care is available:: No  Covid Evaluation: Asymptomatic - no recent exposure (last 10 days) testing not required  Diagnosis: Anemia of unknown etiology [727199]  Admitting Physician: Shelby Mattocks [[id number]]  Attending Physician: Doreene Eland [[id number]]  Certification:: I certify this patient will need inpatient services for at least 2 midnights  Expected Medical Readiness: 10/07/2023          B Medical/Surgery History History reviewed. No pertinent past medical history.    A IV Location/Drains/Wounds Patient Lines/Drains/Airways Status     Active Line/Drains/Airways     Name Placement date Placement time Site Days   Peripheral IV 10/04/23 20 G Anterior;Left Forearm 10/04/23  1841  Forearm  1            Intake/Output Last 24 hours  Intake/Output Summary (Last 24 hours) at 10/05/2023 1321 Last data filed at 10/05/2023 1137 Gross per 24 hour  Intake  334 ml  Output --  Net 334 ml    Labs/Imaging Results for orders placed or performed during the hospital encounter of 10/04/23 (from the past 48 hour(s))  ABO/Rh     Status: None   Collection Time: 10/04/23 11:39 AM  Result Value Ref Range   ABO/RH(D)      O POS Performed at [facility] Lab, [address] [place], [place] [address]   Respiratory (~20 pathogens) panel by PCR     Status: None   Collection Time: 10/04/23  3:39 PM   Specimen: Nasopharyngeal Swab; Respiratory  Result Value Ref Range   Adenovirus NOT DETECTED NOT DETECTED   Coronavirus 229E NOT DETECTED NOT DETECTED    Comment: (NOTE) The Coronavirus on the Respiratory Panel, DOES NOT test for the novel  Coronavirus (2019 nCoV)    Coronavirus HKU1 NOT DETECTED NOT DETECTED   Coronavirus NL63 NOT DETECTED NOT DETECTED   Coronavirus OC43 NOT DETECTED NOT DETECTED   Metapneumovirus NOT DETECTED NOT DETECTED   Rhinovirus / Enterovirus NOT DETECTED NOT DETECTED   Influenza A NOT DETECTED NOT DETECTED   Influenza B NOT DETECTED NOT DETECTED   Parainfluenza Virus 1 NOT DETECTED NOT DETECTED   Parainfluenza Virus 2 NOT DETECTED NOT DETECTED   Parainfluenza Virus 3 NOT DETECTED NOT DETECTED   Parainfluenza Virus 4 NOT DETECTED NOT DETECTED   Respiratory Syncytial Virus NOT DETECTED NOT DETECTED   Bordetella pertussis NOT DETECTED NOT DETECTED  Bordetella Parapertussis NOT DETECTED NOT DETECTED   Chlamydophila pneumoniae NOT DETECTED NOT DETECTED   Mycoplasma pneumoniae NOT DETECTED NOT DETECTED    Comment: Performed at [facility] Lab, [address], [place], [place] [address]  Prepare RBC (crossmatch)     Status: None   Collection Time: 10/04/23  6:22 PM  Result Value Ref Range   Order Confirmation      ORDER PROCESSED BY BLOOD BANK Performed at [facility], [address], [place], [place] [address]   Type and screen [facility]     Status: None (Preliminary result)   Collection  Time: 10/04/23  6:43 PM  Result Value Ref Range   ABO/RH(D) O POS    Antibody Screen NEG    Sample Expiration 10/07/2023,2359    Unit Number [id number]    Blood Component Type RED CELLS,LR    Unit division 00    Status of Unit ISSUED,FINAL    Transfusion Status OK TO TRANSFUSE    Crossmatch Result Compatible    Donor AG Type NEGATIVE FOR E ANTIGEN NEGATIVE FOR KELL ANTIGEN    Unit Number [id number]    Blood Component Type RED CELLS,LR    Unit division 00    Status of Unit ISSUED,FINAL    Transfusion Status OK TO TRANSFUSE    Crossmatch Result Compatible    Donor AG Type NEGATIVE FOR E ANTIGEN NEGATIVE FOR KELL ANTIGEN    Unit Number [id number]    Blood Component Type RED CELLS,LR    Unit division 00    Status of Unit ISSUED    Donor AG Type NEGATIVE FOR E ANTIGEN NEGATIVE FOR KELL ANTIGEN    Transfusion Status OK TO TRANSFUSE    Crossmatch Result Compatible    Unit Number [id number]    Blood Component Type RED CELLS,LR    Unit division 00    Status of Unit ISSUED    Donor AG Type NEGATIVE FOR E ANTIGEN NEGATIVE FOR KELL ANTIGEN    Transfusion Status OK TO TRANSFUSE    Crossmatch Result      Compatible Performed at [facility] Lab, [address], [place], [place] [address]   CBC     Status: Abnormal   Collection Time: 10/04/23  6:44 PM  Result Value Ref Range   WBC 2.2 (L) 4.0 - 10.5 K/uL   RBC 1.98 (L) 4.22 - 5.81 MIL/uL   Hemoglobin 3.5 (LL) 13.0 - 17.0 g/dL    Comment: REPEATED TO VERIFY Reticulocyte Hemoglobin testing may be clinically indicated, consider ordering this additional test [id number] THIS CRITICAL RESULT HAS VERIFIED AND BEEN CALLED TO IMaurice March, RN BY SHAY EKDAHL ON 10 09 2024 AT 1941, AND HAS BEEN READ BACK.     HCT 13.1 (L) 39.0 - 52.0 %   MCV 66.2 (L) 80.0 - 100.0 fL   MCH 17.7 (L) 26.0 - 34.0 pg   MCHC 26.7 (L) 30.0 - 36.0 g/dL   RDW 43.3 (H) 29.5 - 18.8 %   Platelets 72 (L) 150 - 400 K/uL    Comment: REPEATED TO VERIFY   nRBC  1.4 (H) 0.0 - 0.2 %    Comment: Performed at [facility] Lab, [address], [place], [place] [address]  Lactate dehydrogenase     Status: Abnormal   Collection Time: 10/04/23  6:44 PM  Result Value Ref Range   LDH 70 (L) 98 - 192 U/L    Comment: Performed at [facility]  Lab, [address], [place], [place] [address]  Comprehensive metabolic panel     Status: Abnormal   Collection Time: 10/04/23  6:44 PM  Result Value Ref Range   Sodium 146 (H) 135 - 145 mmol/L   Potassium 3.8 3.5 - 5.1 mmol/L   Chloride 111 98 - 111 mmol/L   CO2 22 22 - 32 mmol/L   Glucose, Bld 86 70 - 99 mg/dL    Comment: Glucose reference range applies only to samples taken after fasting for at least 8 hours.   BUN 6 6 - 20 mg/dL   Creatinine, Ser 6.04 0.61 - 1.24 mg/dL   Calcium 8.3 (L) 8.9 - 10.3 mg/dL   Total Protein 5.8 (L) 6.5 - 8.1 g/dL   Albumin 3.3 (L) 3.5 - 5.0 g/dL   AST 16 15 - 41 U/L   ALT 11 0 - 44 U/L   Alkaline Phosphatase 37 (L) 38 - 126 U/L   Total Bilirubin 0.3 0.3 - 1.2 mg/dL   GFR, Estimated >54 >09 mL/min    Comment: (NOTE) Calculated using the CKD-EPI Creatinine Equation (2021)    Anion gap 13 5 - 15    Comment: Performed at [facility] Lab, [address], [place], [place] [address]  Reticulocytes     Status: Abnormal   Collection Time: 10/04/23  6:44 PM  Result Value Ref Range   Retic Ct Pct 0.9 0.4 - 3.1 %   RBC. 2.01 (L) 4.22 - 5.81 MIL/uL   Retic Count, Absolute 17.1 (L) 19.0 - 186.0 K/uL   Immature Retic Fract 40.6 (H) 2.3 - 15.9 %    Comment: Performed at [facility] Lab, [address] [place], [place] [address]  Vitamin B12     Status: None   Collection Time: 10/04/23  9:53 PM  Result Value Ref Range   Vitamin B-12 432 180 - 914 pg/mL    Comment: (NOTE) This assay is not validated for testing neonatal or myeloproliferative syndrome specimens for Vitamin B12 levels. Performed at [facility] Lab, [address], [place], [place] [address]   Iron and  TIBC     Status: Abnormal   Collection Time: 10/04/23  9:53 PM  Result Value Ref Range   Iron 13 (L) 45 - 182 ug/dL   TIBC 086 578 - 469 ug/dL   Saturation Ratios 3 (L) 17.9 - 39.5 %   UIBC 428 ug/dL    Comment: Performed at [facility] Lab, [address] [place], [place] [address]  Ferritin     Status: Abnormal   Collection Time: 10/04/23  9:53 PM  Result Value Ref Range   Ferritin 3 (L) 24 - 336 ng/mL    Comment: Performed at [facility] Lab, [address], [place], [place] [address]  HIV Antibody (routine testing w rflx)     Status: None   Collection Time: 10/04/23 11:20 PM  Result Value Ref Range   HIV Screen 4th Generation wRfx Non Reactive Non Reactive    Comment: Performed at [facility] Lab, [address], [place], [place] [address]  RPR     Status: None   Collection Time: 10/04/23 11:20 PM  Result Value Ref Range   RPR Ser Ql NON REACTIVE NON REACTIVE    Comment: Performed at [facility] Lab, [address] [place], [place] [address]  CBC with Differential/Platelet     Status: Abnormal   Collection Time: 10/05/23  2:33 AM  Result Value Ref Range   WBC 3.0 (L) 4.0 - 10.5 K/uL   RBC 2.60 (L) 4.22 - 5.81 MIL/uL   Hemoglobin 5.3 (LL) 13.0 - 17.0 g/dL    Comment: CRITICAL VALUE NOTED.  VALUE IS CONSISTENT WITH PREVIOUSLY REPORTED AND CALLED VALUE. REPEATED TO VERIFY Reticulocyte Hemoglobin testing may be clinically indicated, consider ordering this additional test [id number]    HCT 18.4 (L) 39.0 - 52.0 %   MCV 70.8 (L) 80.0 - 100.0 fL   MCH 20.4 (L) 26.0 - 34.0 pg   MCHC 28.8 (L) 30.0 - 36.0 g/dL   RDW 56.2 (H) 13.0 - 86.5 %   Platelets 58 (L) 150 - 400 K/uL    Comment: Immature Platelet Fraction may be clinically indicated, consider ordering this additional test [id number]    nRBC 0.7 (H) 0.0 - 0.2 %   Neutrophils Relative % 50 %   Neutro Abs 1.5 (L) 1.7 - 7.7 K/uL   Lymphocytes Relative 47 %   Lymphs Abs 1.4 0.7 - 4.0 K/uL   Monocytes Relative 1  %   Monocytes Absolute 0.0 (L) 0.1 - 1.0 K/uL   Eosinophils Relative 2 %   Eosinophils Absolute 0.1 0.0 - 0.5 K/uL   Basophils Relative 0 %   Basophils Absolute 0.0 0.0 - 0.1 K/uL   nRBC 1 (H) 0 /100 WBC   Abs Immature Granulocytes 0.00 0.00 - 0.07 K/uL   Tear Drop Cells PRESENT    Polychromasia PRESENT    Target Cells PRESENT     Comment: Performed at [facility] Lab, [address] [place], [place] [address]  Comprehensive metabolic panel     Status: Abnormal   Collection Time: 10/05/23  2:33 AM  Result Value Ref Range   Sodium 141 135 - 145 mmol/L   Potassium 3.9 3.5 - 5.1 mmol/L   Chloride 108 98 - 111 mmol/L   CO2 23 22 - 32 mmol/L   Glucose, Bld 76 70 - 99 mg/dL    Comment: Glucose reference range applies only to samples taken after fasting for at least 8 hours.   BUN 6 6 - 20 mg/dL   Creatinine, Ser 3.24 0.61 - 1.24 mg/dL   Calcium 8.4 (L) 8.9 - 10.3 mg/dL   Total Protein 5.6 (L) 6.5 - 8.1 g/dL   Albumin 3.2 (L) 3.5 - 5.0 g/dL   AST 18 15 - 41 U/L   ALT 10 0 - 44 U/L   Alkaline Phosphatase 35 (L) 38 - 126 U/L   Total Bilirubin 0.7 0.3 - 1.2 mg/dL   GFR, Estimated >40 >10 mL/min    Comment: (NOTE) Calculated using the CKD-EPI Creatinine Equation (2021)    Anion gap 10 5 - 15    Comment: Performed at [facility] Lab, [address], [place], [place] [address]  C-reactive protein     Status: None   Collection Time: 10/05/23  2:33 AM  Result Value Ref Range   CRP 0.6 <1.0 mg/dL    Comment: Performed at [facility] Lab, [address] [place], [place] [address]  Sedimentation rate     Status: None   Collection Time: 10/05/23  2:33 AM  Result Value Ref Range   Sed Rate 3 0 - 16 mm/hr    Comment: Performed at [facility] Lab, [address], [place], [place] [address]  Hepatitis panel, acute     Status: None   Collection Time: 10/05/23  2:33 AM  Result Value  Ref Range   Hepatitis B Surface Ag NON REACTIVE NON REACTIVE   HCV Ab NON REACTIVE NON REACTIVE     Comment: (NOTE) Nonreactive HCV antibody screen is consistent with no HCV infections,  unless recent infection is suspected or other evidence exists to indicate HCV infection.     Hep A IgM NON REACTIVE NON REACTIVE   Hep B C IgM NON REACTIVE NON REACTIVE    Comment: Performed at [facility] Lab, [address], [place], [place] [address]  Prepare RBC (crossmatch)     Status: None   Collection Time: 10/05/23  4:00 AM  Result Value Ref Range   Order Confirmation      ORDER PROCESSED BY BLOOD BANK Performed at [facility] Lab, [address], [place], [place] [address]   Urinalysis, Routine w reflex microscopic -Urine, Clean Catch     Status: None   Collection Time: 10/05/23  4:05 AM  Result Value Ref Range   Color, Urine YELLOW YELLOW   APPearance CLEAR CLEAR   Specific Gravity, Urine 1.016 1.005 - 1.030   pH 7.0 5.0 - 8.0   Glucose, UA NEGATIVE NEGATIVE mg/dL   Hgb urine dipstick NEGATIVE NEGATIVE   Bilirubin Urine NEGATIVE NEGATIVE   Ketones, ur NEGATIVE NEGATIVE mg/dL   Protein, ur NEGATIVE NEGATIVE mg/dL   Nitrite NEGATIVE NEGATIVE   Leukocytes,Ua NEGATIVE NEGATIVE    Comment: Performed at [facility], [address] [place], [place] [address]  SARS Coronavirus 2 by RT PCR (hospital order, performed in [facility] hospital lab) *cepheid single result test* Anterior Nasal Swab     Status: None   Collection Time: 10/05/23  6:52 AM   Specimen: Anterior Nasal Swab  Result Value Ref Range   SARS Coronavirus 2 by RT PCR NEGATIVE NEGATIVE    Comment: Performed at [facility] Lab, [address], [place], [place] [address]  Hemoglobin and hematocrit, blood     Status: Abnormal   Collection Time: 10/05/23  8:13 AM  Result Value Ref Range   Hemoglobin 6.8 (LL) 13.0 - 17.0 g/dL    Comment: CRITICAL VALUE NOTED.  VALUE IS CONSISTENT WITH PREVIOUSLY REPORTED AND CALLED VALUE. REPEATED TO VERIFY POST TRANSFUSION SPECIMEN    HCT 23.3 (L) 39.0 - 52.0 %     Comment: Performed at [facility] Lab, [address], [place], [place] [address]  Protime-INR     Status: Abnormal   Collection Time: 10/05/23  8:13 AM  Result Value Ref Range   Prothrombin Time 16.0 (H) 11.4 - 15.2 seconds   INR 1.3 (H) 0.8 - 1.2    Comment: (NOTE) INR goal varies based on device and disease states. Performed at [facility], [address], [place], [place] [address]   Uric acid     Status: None   Collection Time: 10/05/23  8:13 AM  Result Value Ref Range   Uric Acid, Serum 5.8 3.7 - 8.6 mg/dL    Comment: Performed at [facility] Lab, [address], [place], [place] [address]  D-dimer, quantitative     Status: None   Collection Time: 10/05/23  8:13 AM  Result Value Ref Range   D-Dimer, Quant 0.31 0.00 - 0.50 ug/mL-FEU    Comment: (NOTE) At the manufacturer cut-off value of 0.5 g/mL FEU, this assay has a negative predictive value of 95-100%.This assay is intended for use in conjunction with a clinical pretest probability (PTP) assessment model to exclude  pulmonary embolism (PE) and deep venous thrombosis (DVT) in outpatients suspected of PE or DVT. Results should be correlated with clinical presentation. Performed at [facility] Lab, [address], [place], [place] [address]   APTT     Status: None   Collection Time: 10/05/23  8:13 AM  Result Value Ref Range   aPTT 28 24 - 36 seconds    Comment: Performed at [facility] Lab, [address], [place], [place] [address]  Prepare RBC (crossmatch)     Status: None   Collection Time: 10/05/23 10:30 AM  Result Value Ref Range   Order Confirmation      ORDER PROCESSED BY BLOOD BANK Performed at [facility] Lab, [address], [place], [place] [address]    DG CHEST PORT 1 VIEW  Result Date: 10/05/2023 CLINICAL DATA:  Anemia. EXAM: PORTABLE CHEST 1 VIEW COMPARISON:  10/13/2019. FINDINGS: Low lung volume. Bilateral lung fields are clear. Bilateral lateral costophrenic angles are clear.  Normal cardio-mediastinal silhouette, considering the AP technique. No acute osseous abnormalities. The soft tissues are within normal limits. IMPRESSION: No active disease. Electronically Signed   By: Jules Schick M.D.   On: 10/05/2023 07:44   CT Head Wo Contrast  Result Date: 10/04/2023 CLINICAL DATA:  Sickle cell crisis EXAM: CT HEAD WITHOUT CONTRAST TECHNIQUE: Contiguous axial images were obtained from the base of the skull through the vertex without intravenous contrast. RADIATION DOSE REDUCTION: This exam was performed according to the departmental dose-optimization program which includes automated exposure control, adjustment of the mA and/or kV according to patient size and/or use of iterative reconstruction technique. COMPARISON:  None Available. FINDINGS: Brain: No evidence of acute infarction, hemorrhage, hydrocephalus, extra-axial collection or mass lesion/mass effect. Vascular: No hyperdense vessel or unexpected calcification. Skull: Normal. Negative for fracture or focal lesion. Sinuses/Orbits: No acute finding. Other: None IMPRESSION: Negative non contrasted CT appearance of the brain. Electronically Signed   By: Jasmine Pang M.D.   On: 10/04/2023 22:39    Pending Labs Unresulted Labs (From admission, onward)     Start     Ordered   10/05/23 0500  ANA w/Reflex if Positive  Tomorrow morning,   R        10/04/23 2356   10/04/23 2214  Culture, blood (Routine X 2) w Reflex to ID Panel  BLOOD CULTURE X 2,   R (with STAT occurrences)      10/04/23 2213   10/04/23 1545  Rapid urine drug screen (hospital performed)  ONCE - STAT,   STAT        10/04/23 1544            Vitals/Pain Today's Vitals   10/05/23 1135 10/05/23 1137 10/05/23 1148 10/05/23 1200  BP: 111/67  121/84 122/82  Pulse: 64  68 62  Resp: 14  15   Temp: 98.1 F (36.7 C) 98.1 F (36.7 C) 98.1 F (36.7 C)   TempSrc:   Oral   SpO2: 100%  100% 100%  Weight:      Height:      PainSc:        Isolation  Precautions Airborne and Contact precautions  Medications Medications  thiamine (VITAMIN B1) tablet 100 mg (100 mg Oral Patient Refused/Not Given 10/05/23 1040)    Or  thiamine (VITAMIN B1) injection 100 mg ( Intravenous See Alternative 10/05/23 1040)  folic acid (FOLVITE) tablet 1 mg (1 mg Oral Patient Refused/Not Given 10/05/23 1040)  multivitamin with minerals tablet 1  tablet (1 tablet Oral Patient Refused/Not Given 10/05/23 1040)  ferrous sulfate tablet 325 mg (325 mg Oral Patient Refused/Not Given 10/05/23 1040)  0.9 %  sodium chloride infusion (Manually program via Guardrails IV Fluids) (has no administration in time range)  ziprasidone (GEODON) injection 20 mg (20 mg Intramuscular Given 10/04/23 1717)  sterile water (preservative free) injection (  Given 10/04/23 1718)  0.9 %  sodium chloride infusion (Manually program via Guardrails IV Fluids) (0 mLs Intravenous Stopped 10/04/23 2003)  0.9 %  sodium chloride infusion (Manually program via Guardrails IV Fluids) (0 mLs Intravenous Stopped 10/05/23 0353)  ferumoxytol (FERAHEME) 510 mg in sodium chloride 0.9 % 100 mL IVPB (0 mg Intravenous Stopped 10/05/23 1055)    Mobility walks     Focused Assessments    R Recommendations: See Admitting Provider Note  Report given to:   Additional Notes:

## 2023-10-05 NOTE — Assessment & Plan Note (Signed)
EtOH: 218.  Per patient, he had not been drinking for several days before admission to the hospital.  Does not appear to be undergoing acute withdrawal.  CIWAs have been 0 thus far.  We will continue for the next 24 to 48 hours. - Continue CIWA protocol every 6 hours - Patient refusing thiamine supplementation as above

## 2023-10-05 NOTE — Plan of Care (Signed)
  Problem: Activity: Goal: Risk for activity intolerance will decrease Outcome: Progressing   Problem: Nutrition: Goal: Adequate nutrition will be maintained Outcome: Progressing   Problem: Elimination: Goal: Will not experience complications related to bowel motility Outcome: Progressing   Problem: Pain Managment: Goal: General experience of comfort will improve Outcome: Progressing   

## 2023-10-05 NOTE — Progress Notes (Signed)
FMTS Brief Progress Note  S: Patient states he does not feel any different after his blood transfusions although he did not have any symptoms prior to transfusions.  No complaints at this time.  O: BP 127/84 (BP Location: Left Arm)   Pulse 71   Temp 98.8 F (37.1 C) (Oral)   Resp 18   Ht 5\' 8"  (1.727 m)   Wt 63.5 kg   SpO2 100%   BMI 21.29 kg/m   General: Well-appearing watching TV, NAD CV: RRR, 3/6 systolic murmur appreciated Neuro: Conversationally appropriate Psych: Calm, normal mood and affect  A/P: Pancytopenia Status post 4 units PRBC with post H&H 7.5.  Remaining pancytopenic workup per day team and hematology.  Psychosis IVC remains and one-to-one continuous monitoring.  Psychiatry to see tomorrow a.m.  As needed Haldol if necessary. - Orders reviewed. Labs for AM ordered, which was adjusted as needed.   Shelby Mattocks, DO 10/05/2023, 8:42 PM PGY-3,  Family Medicine Night Resident  Please page [phone] with questions.

## 2023-10-05 NOTE — Hospital Course (Addendum)
Alfred Phillips is a 37 y.o. male who was admitted to the [facility] Medicine Teaching Service at [facility] for ***. Hospital course is outlined below by problem.   Pancytopenia Presented with suspected psychosis as below.  HDS.  Found to have hemoglobin 4.0, WBC 2.2, platelets 72.  Ferritin 3.  Vitamin B12, CRP, sed rate, hepatitis panel, ANA negative.  Blood smear with severe microcytic hypochromic anemia with Anisa poikilocytosis compatible with IDA. He was ultimately given 3 units of blood over admission.  Documented history of sickle cell anemia.  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  Heme/onc consulted who felt pancytopenia likely due to profound iron deficiency anemia.  IV Iron x1 10/10 and PO iron 325.  FOBT showed ***.  CT A/P: *** At no point did patient show any frank bleeding  or any other anemia symptoms .  Hemoglobin improved to 8.4 *** on discharge.  Thrombocytopenia, profound Patient found thrombocytopenic to <20 10/12.  Was given 3 units of platelets, remained within 12-16 range.  Attempted a fourth unit when patient developed an elevated temperature (100.2). Transfusion reaction protocol was initiated and showed ***.  Hematology recommended bone marrow biopsy, patient refused.   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  Alcohol level elevated as below. Patient's medical screening demonstrated pancytopenia as above which prompted transfer to the ED.  Psychiatry was consulted who IVC'd patient and gave Geodon with improvement in mood/affect.  Patient was evaluated for capacity to make decisions: Expressed insight concerning medical conditions, was found to have capacity to refuse workup/treatment.  However, patient was not found to have capacity to refuse psychiatric medications. IVC could not be renewed 10/16*** as patient did not exhibit frank psychotic symptoms at that time.   Alcohol use Alcohol level on admission  218.  CIWA was placed along with MVI, thiamine, folic acid.  No Ativan was needed over admission***.  Issues for follow up: Ensure continued follow-up with psychiatry Recommend recheck CBC

## 2023-10-05 NOTE — ED Notes (Signed)
This paramedic sat with patient for first 15 min after starting blood administration. No reactions noted, pt states he feels fine.

## 2023-10-06 DIAGNOSIS — D61818 Other pancytopenia: Principal | ICD-10-CM

## 2023-10-06 DIAGNOSIS — Z789 Other specified health status: Secondary | ICD-10-CM | POA: Diagnosis not present

## 2023-10-06 DIAGNOSIS — F29 Unspecified psychosis not due to a substance or known physiological condition: Secondary | ICD-10-CM | POA: Diagnosis not present

## 2023-10-06 DIAGNOSIS — D649 Anemia, unspecified: Secondary | ICD-10-CM | POA: Diagnosis not present

## 2023-10-06 LAB — TYPE AND SCREEN
ABO/RH(D): O POS
Antibody Screen: NEGATIVE
Donor AG Type: NEGATIVE
Donor AG Type: NEGATIVE
Donor AG Type: NEGATIVE
Donor AG Type: NEGATIVE
Unit division: 0
Unit division: 0
Unit division: 0
Unit division: 0

## 2023-10-06 LAB — BPAM RBC
Blood Product Expiration Date: 202410302359
Blood Product Expiration Date: 202410312359
Blood Product Expiration Date: 202411032359
Blood Product Expiration Date: 202411032359
ISSUE DATE / TIME: 202410091955
ISSUE DATE / TIME: 202410092217
ISSUE DATE / TIME: 202410100343
ISSUE DATE / TIME: 202410101122
Unit Type and Rh: 5100
Unit Type and Rh: 5100
Unit Type and Rh: 9500
Unit Type and Rh: 9500

## 2023-10-06 LAB — CBC
HCT: 25.6 % — ABNORMAL LOW (ref 39.0–52.0)
Hemoglobin: 7.6 g/dL — ABNORMAL LOW (ref 13.0–17.0)
MCH: 22.6 pg — ABNORMAL LOW (ref 26.0–34.0)
MCHC: 29.7 g/dL — ABNORMAL LOW (ref 30.0–36.0)
MCV: 76.2 fL — ABNORMAL LOW (ref 80.0–100.0)
Platelets: 29 10*3/uL — CL (ref 150–400)
RBC: 3.36 MIL/uL — ABNORMAL LOW (ref 4.22–5.81)
RDW: 27.3 % — ABNORMAL HIGH (ref 11.5–15.5)
WBC: 2.6 10*3/uL — ABNORMAL LOW (ref 4.0–10.5)
nRBC: 1.9 % — ABNORMAL HIGH (ref 0.0–0.2)

## 2023-10-06 LAB — COMPREHENSIVE METABOLIC PANEL
ALT: 12 U/L (ref 0–44)
AST: 21 U/L (ref 15–41)
Albumin: 3.3 g/dL — ABNORMAL LOW (ref 3.5–5.0)
Alkaline Phosphatase: 42 U/L (ref 38–126)
Anion gap: 7 (ref 5–15)
BUN: 6 mg/dL (ref 6–20)
CO2: 24 mmol/L (ref 22–32)
Calcium: 8.6 mg/dL — ABNORMAL LOW (ref 8.9–10.3)
Chloride: 108 mmol/L (ref 98–111)
Creatinine, Ser: 0.97 mg/dL (ref 0.61–1.24)
GFR, Estimated: >60 mL/min (ref >60)
Glucose, Bld: 101 mg/dL — ABNORMAL HIGH (ref 70–99)
Potassium: 4.1 mmol/L (ref 3.5–5.1)
Sodium: 139 mmol/L (ref 135–145)
Total Bilirubin: 1 mg/dL (ref 0.3–1.2)
Total Protein: 6.1 g/dL — ABNORMAL LOW (ref 6.5–8.1)

## 2023-10-06 LAB — ANA W/REFLEX IF POSITIVE: Anti Nuclear Antibody (ANA): NEGATIVE

## 2023-10-06 MED ORDER — HALOPERIDOL 1 MG PO TABS
5.0000 mg | ORAL_TABLET | Freq: Two times a day (BID) | ORAL | Status: DC
  Administered 2023-10-06 – 2023-10-11 (×11): 5 mg via ORAL
  Filled 2023-10-06 (×11): qty 5

## 2023-10-06 MED ORDER — DIPHENHYDRAMINE HCL 25 MG PO CAPS: 50.0000 mg | ORAL_CAPSULE | Freq: Three times a day (TID) | ORAL | Status: DC | PRN

## 2023-10-06 MED ORDER — HALOPERIDOL 1 MG PO TABS: 5.0000 mg | ORAL_TABLET | Freq: Three times a day (TID) | ORAL | Status: DC | PRN

## 2023-10-06 MED ORDER — DIPHENHYDRAMINE HCL 50 MG/ML IJ SOLN: 50.0000 mg | Freq: Four times a day (QID) | INTRAMUSCULAR | Status: DC | PRN

## 2023-10-06 MED ORDER — TRAZODONE HCL 50 MG PO TABS: 50.0000 mg | ORAL_TABLET | Freq: Every evening | ORAL | Status: DC | PRN

## 2023-10-06 MED ORDER — BENZTROPINE MESYLATE 0.5 MG PO TABS
0.5000 mg | ORAL_TABLET | Freq: Every day | ORAL | Status: DC
  Administered 2023-10-06 – 2023-10-11 (×6): 0.5 mg via ORAL
  Filled 2023-10-06 (×6): qty 1

## 2023-10-06 MED ORDER — DIPHENHYDRAMINE HCL 50 MG/ML IJ SOLN: 50.0000 mg | Freq: Three times a day (TID) | INTRAMUSCULAR | Status: DC | PRN

## 2023-10-06 MED ORDER — SODIUM CHLORIDE 0.9 % IV SOLN
1.0000 mg | Freq: Every day | INTRAVENOUS | Status: DC
  Administered 2023-10-06: 1 mg via INTRAVENOUS
  Filled 2023-10-06 (×2): qty 0.2

## 2023-10-06 MED ORDER — LORAZEPAM 1 MG PO TABS: 2.0000 mg | ORAL_TABLET | Freq: Three times a day (TID) | ORAL | Status: DC | PRN

## 2023-10-06 MED ORDER — FOLIC ACID 1 MG PO TABS
1.0000 mg | ORAL_TABLET | Freq: Every day | ORAL | Status: DC
  Administered 2023-10-07: 1 mg via ORAL
  Filled 2023-10-06: qty 1

## 2023-10-06 MED ORDER — HYDROXYZINE HCL 25 MG PO TABS: 25.0000 mg | ORAL_TABLET | Freq: Three times a day (TID) | ORAL | Status: DC | PRN

## 2023-10-06 MED ORDER — LORAZEPAM 2 MG/ML IJ SOLN: 2.0000 mg | Freq: Three times a day (TID) | INTRAMUSCULAR | Status: DC | PRN

## 2023-10-06 MED ORDER — HALOPERIDOL LACTATE 5 MG/ML IJ SOLN: 5.0000 mg | Freq: Three times a day (TID) | INTRAMUSCULAR | Status: DC | PRN

## 2023-10-06 NOTE — TOC Initial Note (Signed)
Transition of Care [facility]) - Initial/Assessment Note    Patient Details  Name: Alfred Phillips MRN: [id number] Date of Birth: Jan 20, 1986  Transition of Care [facility]) CM/SW Contact:    Addilyne Backs A [place], Theresia Majors Phone Number: 10/06/2023, 4:29 PM  Clinical Narrative:                  CSW met with pt at bedside. He stated that his family brought him into hospital and that they are lying about his behaviors and he denies having any issues with alcohol or other illicit substances.  He states that he would like assistance with immigration attorney so he can find work. CSW put social service resources and housing resources in pt's AVS.   Barriers to discharge, continued medical work up.   TOC will continue to follow.   Expected Discharge Plan: Psychiatric Hospital     Patient Goals and CMS Choice Patient states their goals for this hospitalization and ongoing recovery are:: Pt denies          Expected Discharge Plan and Services                                              Prior Living Arrangements/Services                       Activities of Daily Living   ADL Screening (condition at time of admission) Independently performs ADLs?: Yes (appropriate for developmental age) Is the patient deaf or have difficulty hearing?: No Does the patient have difficulty seeing, even when wearing glasses/contacts?: No Does the patient have difficulty concentrating, remembering, or making decisions?: No  Permission Sought/Granted                  Emotional Assessment Appearance:: Disheveled Attitude/Demeanor/Rapport: Engaged Affect (typically observed): Calm Orientation: : Oriented to Self Alcohol / Substance Use: Tobacco Use Psych Involvement: Yes (comment)  Admission diagnosis:  Iron deficiency anemia due to chronic blood loss [D50.0] Alcohol use [Z78.9] Anemia of unknown etiology [D64.9] Pancytopenia (HCC) [D61.818] Psychosis, unspecified psychosis type (HCC)  [F29] Patient Active Problem List   Diagnosis Date Noted   Alcohol use 10/05/2023   Psychosis (HCC) 10/05/2023   Anemia of unknown etiology 10/04/2023   PCP:  Pcp, No Pharmacy:   [name] [address] [place] [address] Phone: [phone] Fax: [phone]     Social Determinants of Health (SDOH) Social History: SDOH Screenings   Food Insecurity: No Food Insecurity (10/05/2023)  Housing: Low Risk  (10/05/2023)  Transportation Needs: No Transportation Needs (10/05/2023)  Utilities: Not At Risk (10/05/2023)  Tobacco Use: High Risk (10/05/2023)   SDOH Interventions:     Readmission Risk Interventions     No data to display

## 2023-10-06 NOTE — Consult Note (Addendum)
Psychiatry team was also consulted by [facility] Medicine team for patient's ability to refuse treatment of current medical problems, specifically his low hemoglobin and platelets.   In an evaluation of capacity, each of the following criteria must be met based on medical necessity in order for a patient to have capacity to make the decision in question. Of note, the capacity evaluation assesses only for the specified decision documented above and is not a determination of the patient's overall competency, which can only be adjudicated.  Criterion 1: The patient demonstrates a clear and consistent voluntary choice with regard to treatment options. While the [facility] team mentioned that patient was not currently refusing any medical treatment, the patient verbalized that if he required additional platelets and the team offered them, that he would accept them.  Criterion 2: The patient adequately understands the disease they have, the treatment proposed, the risks of treatment, and the risks of other treatment (including no treatment). The patient was able to verbalize that he understood that he came to the hospital because he had a low hemoglobin and he required additional blood to stabilize his hemoglobin. He also recognizes that he currently has a low platelet count and the risks associated with a low platelet count if left untreated (ie, death if unable to stop bleed). He understands that if he requires additional platelets, this would help prevent his body from being unable to stop the bleeding. Criterion 3: The patient acknowledges that the details of Criterion 2 apply to them specifically and the likely consequences of treatment options proposed. The patient understood that if his platelets were too low, he could die because his body would not be able to stop the bleeding.   Criterion 4: The patient demonstrates adequate reasoning/rationality within the context of their decision and can provide justification  for their choice. As above, the patient understood the consequence of refusing platelets if indicated (death, body unable to stop bleeding).  In this case, the patient has capacity to refuse treatment of his current medical problems, specifically his low hemoglobin and platelets if he is offered these treatments and refuses. Of note, patient has not currently been offered these treatments and has not refused.

## 2023-10-06 NOTE — Assessment & Plan Note (Signed)
Hemoglobin 3.5 ? 5.3 ? 6.8 s/p 3 units pRBC.  Another unit given in a.m. 10/10.  Patient refusing further labs.  Leading differential diagnosis at this time include: Bone marrow malignancy (lymphoma versus leukemia), vitamin deficiency, viral process in the setting of immunodeficiency, myelodysplastic syndrome, paroxysmal nocturnal hemoglobinuria.  Microcytic anemia is expected in the setting of severe iron deficiency, however this does not explain his thrombocytopenia, leukopenia. Further, we do not believe that sickle cell trait is sufficient to explain his current presentation.  Reasonable suspicion for occult blood loss. - Ordered PO iron 325 mg, multivitamin, folic acid, thiamine: Patient refused all of these 10/10. - CBC/CMP ordered for a.m. of 10/11.  We will discuss further options if patient continues to refuse labs. - +/- combined systolic/diastolic murmur (flow murmur?)  We will recheck tomorrow. - Hematology consult placed, appreciate recommendations.  Bone marrow biopsy? - CBC with differential: +Tear drop cells, + polychromasia, +target cells.  Reticulocyte count inappropriately low. - Hepatitis/HIV/RPR negative - Sed rate/CRP within normal limits. - Vitamin B12 within normal limits - Awaiting ANA with reflex

## 2023-10-06 NOTE — Consult Note (Signed)
[facility] Psychiatry Consult Evaluation  Service Date: October 06, 2023 LOS:  LOS: 2 days   Primary Psychiatric Diagnoses  Bipolar disorder, current episode manic (r/o schizoaffective disorder bipolar type, r/o underlying medical condition contributing)  Assessment  Alfred Phillips is a 37 y.o. male admitted medically for 10/04/2023  2:51 PM for pancytopenia. He carries the psychiatric diagnoses of none and has a past medical history of sickle cell disease. Psychiatry was consulted for concern for psychosis by Tomie China, MD.  His current presentation of intermittent grandiosity, delusions, paranoia, poor sleep, increased energy, increased impulsivity in the past 2 years and worsening over the past 6 months is most consistent with either psychosis due to an underlying medical condition (his anemia, ?autoimmune process) or a primary psychiatric diagnosis of bipolar disorder, current episode manic. Unclear if he is having psychosis even when he is not in a mood episode given that he is a poor historian and the fact that he is not staying with collateral all the time. No mental health history or psychiatric hospitalizations prior to this. On initial examination, patient appears calm but he has paranoia related to his mother, disorganized thinking related to his ability to work. He minimizes and/or denies all the symptoms leading up to his hospitalization but collateral reports that prior to admission patient was  having delusions, decreased sleep, rapid speech, grandiosity, decrease in goal-directed activities. Recommend starting haldol to treat his psychosis, adding cogentin for EPS prevention given risk factors (young AA male). Please see plan below for detailed recommendations.   Diagnoses:  Active Hospital problems: Principal Problem:   Anemia of unknown etiology Active Problems:   Alcohol use   Psychosis (HCC)     Plan   ## Psychiatric Medication Recommendations:  -- start haldol 5mg   BID for psychosis  -- start cogentin 0.5mg  daily for EPS prevention -- start hydroxyzine 25mg  TID PRN for anxiety  -- start trazodone 50mg  PRN for sleep  -- start PRN agitation protocol: haldol, benadryl, ativan -- start IM benadryl 50mg  Q6H PRN for acute dystonic reaction   ## Medical Decision Making Capacity:  -- not formally assessed   ## Further Work-up:  -- per primary   -- most recent EKG on 10/9 had QtC of 466 -- Pertinent labwork reviewed earlier this admission includes: ethanol 218, TSH wnl, UDS wnl, iron 13, %sat 3, ferritin 3, HIV NR, RPR NR, ESR/CRP wnl, CT head wnl, hepatitis panel wnl, CBC Hgb 4 -> 5.3. WBC 3. ANA negative.   ## Disposition:  -- currently meets criteria for inpatient psych  ## Behavioral / Environmental:  --   No specific recommendations at this time.   #Legal --INVOL  ## Safety and Observation Level:  - Based on my clinical evaluation, I estimate the patient to be at high risk of self harm in the current setting - At this time, we recommend a 1:1 level of observation. This decision is based on my review of the chart including patient's history and current presentation, interview of the patient, mental status examination, and consideration of suicide risk including evaluating suicidal ideation, plan, intent, suicidal or self-harm behaviors, risk factors, and protective factors. This judgment is based on our ability to directly address suicide risk, implement suicide prevention strategies and develop a safety plan while the patient is in the clinical setting. Please contact our team if there is a concern that risk level has changed.  Suicide risk assessment  Patient has following modifiable risk factors for suicide: psychosis which we  are addressing by treating his psychosis.   Patient has following non-modifiable or demographic risk factors for suicide: male gender  Patient has the following protective factors against suicide: Supportive family, no  history of suicide attempts, and no history of NSSIB  Thank you for this consult request. Recommendations have been communicated to the primary team.  We will continue to follow at this time.   Karie Fetch, MD, PGY-2  Psychiatric and Social History   Relevant Aspects of Hospital Course:  Admitted on 10/04/2023 for pancytopenia.  -S/p 2u PRBC  Initially brought to [facility] under IVC: "THE RESPONDENT HAS BEEN UP ALL NIGHT, HE SAYS HE DOESN'T have time to eat or sleep.  The respondent believes he is a God and the devil.  Responded has threatened to kill his family by poisoning their food.  The respondent rambles when he speaks, making nonsensical statements.  The respondent is aggressive towards me and the rest of the family.  The respondent drinks and smokes marijuana daily. IVC petitioner is Burnis Medin patient's mother." At [facility], patient stated that he was brought for "playing music at 5am" Per the police,   Patient at [facility] appeared to be labile (irritable, anxious, euthymic, guarded) and speech was clear with increased volume, periods of pauses consistent with thought blocking vs selectively mute.  In the ED, he endorsed that he had not been sleeping and he told the EDP that "I cannot die" and "I am of God." He was given IM geodon in the ED.   Patient Report:  Patient is seen laying in his bed. 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 He denies any racing thoughts. 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 He initially tells this Clinical research associate his artist name is Therapist, art, then he tells this Clinical research associate that he doesn't want that  to be written down. I asked him about what was written in his IVC and he vehemently denies it, states "maybe she said those things because she doesn't want a 37 year old living in her home anymore." He reports that he just needs help with housing. He denies any racing thoughts, grandiosity, delusions. He denies that he had any trouble sleeping prior to hospitalization. He denies any issues with his appetite. Discussed that we would be starting medications and he reports he feels like he doesn't need medications because he's perfectly healthy and the only thing that he needs help with is housing. Later on, he is agreeable to taking the haldol. He denies any anxiety. He denies any past psychiatric history. He denies SI/HI/AVH.   Psych ROS:  Depression: Patient denies any depressive symptoms prior to hospitalization.  Anxiety:  He also denies any anxiety symptoms. Mania: He denies any manic symptoms but reports that at times he can stay up all night writing 10 songs while also doing push-ups. Per chart review, had grandiosity, delusions of being God. Psychosis: He has some paranoia towards his mom. He denies having delusions, thought insertion, withdrawal, or broadcasting. He denies AVH.   Collateral information:  Contacted mom Burnis Medin at [phone] on 10/06/23 Reports usually he likes to  drink. He drinks alcohol for a long time. But then he was saying he was talking saying he was God that he can kill you. He sometimes tells her he is not his mom. He wants daddy to tell her he is not his mom. Reports he started to insult his uncle. Reports he has a history of trespassing until 6am. Reports he was playing his music until the night. He used to be able to cook, everything. He will talk and worship. He will sometimes say he is God and everything belongs to him, reports this behavior has been going on for 2 years, 6 months ago it got worse, violence and threats. Drinking, not sleeping. Rapid speech. He's  fighting with her and his siblings. No history of mental health. He didn't want to go to hospital. When he's not drinking, 3-4 bottles of wine or gin, unsure of frequency. He is intermittently at home. on the streets, and in jail. When he also drinks gets really violent. He can walk 30 miles to look for a drink. If he don't drink, he doesn't fight anybody. If he drinks, he will state that he can help you go to hell. He's also said that he can poison your food. Reports they called the police on him because at night he didn't sleep because he kept talking loudly, walking around, playing music, saying things that didn't make sense, how he was in jail and he goes intentionally, he's better off in jail. Stating if family is not careful, he will help [place] go to jail. He told mom that she can go to [place]. It's only dead people that are free. Night before that he wasn't at home, he was just released from jail. Will do same to people outside and then they will call him for harrassment. He will also get arrested for stealing because he thinks everything belongs to him because he is God. Don't know access to gun.   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 Lives with mother, is intermittently homeless. Moved to [place] from [place] Considers support system to be God.  Denies having people that support him.   Access to weapons: denies   Substance History Tobacco use: yes, 1/2 ppd. Declines tobacco replacement therapy  Alcohol use: reports drinking sometimes, whenever I get it. No cravings, no withdrawals. Is not forthcoming with how much he is drinking when not in hospital  Cannabis: denies   Exam Findings   Psychiatric Specialty Exam:  Presentation  General Appearance: Fairly Groomed  Eye Contact:Fair  Speech:Clear and Coherent  Speech Volume:Normal  Handedness:Right   Mood and Affect  Mood:Angry  Affect:Labile   Thought Process  Thought Processes:-- (mostly linear but some disorganization)  Descriptions of Associations:Intact  Orientation:Full (Time, Place and Person)  Thought Content:-- (Paranoia regarding his mother, ruminates on discharge)  Hallucinations:Hallucinations: None  Ideas of Reference:None  Suicidal Thoughts:Suicidal Thoughts: No  Homicidal Thoughts:Homicidal Thoughts: No   Sensorium  Memory:Immediate Poor  Judgment:Impaired  Insight:Poor   Executive Functions  Concentration:Poor  Attention Span:Fair  Recall:Poor  Fund of Knowledge:Fair  Language:Fair   Psychomotor Activity  Psychomotor Activity:Psychomotor Activity: Normal   Assets  Assets:Communication Skills; Desire for Improvement; Social Support   Sleep  Sleep:Sleep: Poor    Physical Exam: Vital signs:  Temp:  [98.1 F (36.7 C)-99.1 F (37.3 C)] 98.2 F (36.8 C) (10/11 0419) Pulse Rate:  [55-71] 60 (10/11 0419) Resp:  [16-19] 18 (10/11 0419) BP: (109-127)/(65-84) 123/84 (10/11 0419) SpO2:  [100 %] 100 % (10/11 0419) Physical Exam Constitutional:      Appearance: Normal appearance.  HENT:     Head: Normocephalic and atraumatic.  Pulmonary:     Effort: Pulmonary effort is normal.  Skin:    General: Skin is warm and dry.  Neurological:     General: No focal deficit present.     Mental Status: He is alert.   Psychiatric:        Mood and Affect: Affect is labile.        Behavior: Behavior is cooperative.        Thought Content: Thought content is paranoid.     Comments: Speech can be normal rate but become more rapid  Cooperative but can get agitated easily     Blood pressure 123/84, pulse 60, temperature 98.2 F (36.8 C), temperature source Oral, resp. rate 18, height 5\' 8"  (1.727 m), weight 63.5 kg, SpO2 100%. Body mass index is 21.29 kg/m.   Other History   These have been pulled in through the EMR, reviewed, and updated if appropriate.   Family History:  None The patient's family history is not on file.  Medical History: Past medical history of sickle cell trait   Surgical History: None  Medications:   Current Facility-Administered Medications:    benztropine (COGENTIN) tablet 0.5 mg, 0.5 mg, Oral, Daily, Karie Fetch, MD   haloperidol (HALDOL) tablet 5 mg, 5 mg, Oral, TID PRN **AND** LORazepam (ATIVAN) tablet 2 mg, 2 mg, Oral, TID PRN **AND** diphenhydrAMINE (BENADRYL) capsule 50 mg, 50 mg, Oral, TID PRN, Massengill, Harrold Donath, MD   haloperidol lactate (HALDOL) injection 5 mg, 5 mg, Intramuscular, TID PRN **AND** LORazepam (ATIVAN) injection 2 mg, 2 mg, Intramuscular, TID PRN **AND** diphenhydrAMINE (BENADRYL) injection 50 mg, 50 mg, Intramuscular, TID PRN, Massengill, Harrold Donath, MD   diphenhydrAMINE (BENADRYL) injection 50 mg, 50 mg, Intramuscular, Q6H PRN, Karie Fetch, MD   ferrous sulfate tablet 325 mg, 325 mg, Oral, QODAY, Dahbura, Anton, DO   folic acid (FOLVITE) tablet 1 mg, 1 mg, Oral, Daily **OR** folic acid 1 mg in sodium chloride 0.9 % 50 mL IVPB, 1 mg, Intravenous, Daily, Tomie China, MD   haloperidol (HALDOL) tablet 5 mg, 5 mg, Oral, Q12H, Massengill, Nathan, MD   hydrOXYzine (ATARAX) tablet 25 mg, 25 mg, Oral, TID PRN, Phineas Inches, MD   multivitamin with minerals tablet 1 tablet, 1 tablet, Oral, Daily, Dahbura, Anton, DO   thiamine (VITAMIN B1)  tablet 100 mg, 100 mg, Oral, Daily **OR** thiamine (VITAMIN B1) injection 100 mg, 100 mg, Intravenous, Daily, Dahbura, Anton, DO   traZODone (DESYREL) tablet 50 mg, 50 mg, Oral, QHS PRN, Phineas Inches, MD  Allergies: No Known Allergies

## 2023-10-06 NOTE — Assessment & Plan Note (Addendum)
Patient largely flat this morning.  Sitter in room.  Minimally responsive.  Very guarded.  Behavior, appearance within normal limits.  Mood congruent.  Denied SI/HI yesterday.  Judgment and insight very poor.  Did not appear to be responding to internal stimuli. Patient meets criteria for inpatient psychiatry. Notes high risk of self-harm - Psychiatry preliminary recs as follows: -- Begin haldol 5mg  BID for psychosis with concurrent Cogentin for EPS control. -- Begin hydroxyzine 25mg  TID PRN for anxiety. -- Begin trazodone 50mg  PRN for sleep. -- Begin PRN agitation protocol: haldol, benadryl, ativan. - Requested capacity evaluation, Dr. Okey Dupre will join psychiatry team at 2:00 pm. - Patient is under active IVC orders from 10/9 to 10/16.  Will need to be renewed by then, if necessary. - 1:1 continuous monitoring ordered

## 2023-10-06 NOTE — Assessment & Plan Note (Signed)
Reviewed prior lab work, hemoglobinopathy evaluation revealed hemoglobin S of 29.5% meeting criteria for heterozygous sickle cell trait.  Presents with hemoglobin 4.0, repeat 3.5.  Further leukopenic at 2.2, neutropenic 0.4, platelets 72, reduced absolute reticulocyte count 17.1.  Type and screen O+ and negative for Kell antigen.  Currently receiving PRBC transfusion of 2 units.  His iron labs suggest profound iron deficiency at the very least. -Admit to FMTS, med tele unit, attending Dr. Lum Babe -Hematology following, appreciate assistance -Post H&H 2 hours after transfusion, transfusion threshold<7 -Avoid excess transfusions given sickle cell status -AM CBC with differential, CMP, hepatitis panel, ANA, ESR, CRP, UA -Follow-up blood cultures, pathology smear -Follow-up RPR, HIV -Consider repeat hemoglobin electrophoresis -Ferrous sulfate 325 mg every other day -Neutropenic precautions for ANC<0.5

## 2023-10-06 NOTE — Assessment & Plan Note (Addendum)
Hemoglobin 7.6 this morning, stable. Thrombocytopenic to 23.  Fall precautions initiated.   Per hematology luminary note: Initial labs concerning for severe iron deficiency anemia.  No evidence of hemolysis.  Etiology of pancytopenia may be severe iron deficiency.  Likely not related to his sickle cell trait.  He will continue to follow.  - For thrombocytopenia: heme recommends bone marrow biopsy to look for disorder. Wait 4-5 days after iron infusion (given 10/10) to improve. Transfusion @ <20 platelets.  - S/p IV iron x1. Patient has been refusing all p.o. medications.   - Per heme recs, we will consider GI consult after patient stabilized. - Hemoglobin 3.5 ? 5.3 ? 6.8 ? 7.5 ? 7.6 after 4 units pRBC. - Trend CBCs qDay  - Continue to replete hemoglobin with PRBC if <7.0 - Will consider GI consult after further workup. Limited by participation.

## 2023-10-06 NOTE — Assessment & Plan Note (Addendum)
Patient denies withdrawal symptoms.  CIWAs continued to be negative.   - Continue CIWA protocol every 6 hours.  Consider discontinuation if CIWAs remain 0 at 48 hours. - Patient refusing thiamine supplementation as above

## 2023-10-06 NOTE — Plan of Care (Addendum)
FMTS Plan-of-Care note:  Writer accompanied psychiatry consult service to determine Mr. Ayo capacity to refuse medical treatment.  Patient successfully outlined the medical options available to him (for example, choosing whether or not to accept IV platelets should his labs worsen), as well as the potential/likely consequences of said options (that is: if IV platelets refused, then being at higher risk for uncontrolled bleeding after a fall.)  He reiterated that he is here involuntarily, and that much of this hospital stay has felt like "a violation."  Per psychiatry team, patient currently has capacity to refuse workup and treatment for his medical issues (thrombocytopenia, iron deficiency anemia) at this time.  Please see their forthcoming analysis for further detail.  Luiz Iron, MD Acting FMTS resident, PGY-1

## 2023-10-06 NOTE — Progress Notes (Signed)
Daily Progress Note Intern Pager: [phone]  Patient name: Collie Bambrick Medical record number: [id number] Date of birth: [birth year] Age: 37 y.o. Gender: male  Primary Care Provider: Pcp, No Consultants: Psychiatry, hematology Code Status: Full  Pt Overview and Major Events to Date:   Carmin Malen is a 37 year old male with a history of sickle cell trait and poor medical follow-up who presents to the [facility] emergency department under IVC for psychosis, manic symptoms, and pancytopenia.  Per chart review, he was playing very loud music, staying up all night, saying that he was "a God and the devil". EtOH: 218.  UDS negative.  Received IM Geodon 20 mg as needed x1 for agitation and found to have a hemoglobin of 4.0 ? 3.5 on admission to the [facility]. CBC and iron labs indicate iron deficiency anemia. Now s/p 4u pRBC. Notably, in 2020, patient was admitted to the [facility] emergency department with a hemoglobin of 5.5 found to be similarly iron deficient. S/p IV iron 10/10.  Currently undergoing AMS/anemia workup.  Assessment & Plan Anemia of unknown etiology Hemoglobin 7.6 this morning, stable. Thrombocytopenic to 23.  Fall precautions initiated.   Per hematology luminary note: Initial labs concerning for severe iron deficiency anemia.  No evidence of hemolysis.  Etiology of pancytopenia may be severe iron deficiency.  Likely not related to his sickle cell trait.  He will continue to follow.  - For thrombocytopenia: heme recommends bone marrow biopsy to look for disorder. Wait 4-5 days after iron infusion (given 10/10) to improve. Transfusion @ <20 platelets.  - S/p IV iron x1. Patient has been refusing all p.o. medications.   - Per heme recs, we will consider GI consult after patient stabilized. - Hemoglobin 3.5 ? 5.3 ? 6.8 ? 7.5 ? 7.6 after 4 units pRBC. - Trend CBCs qDay  - Continue to replete hemoglobin with PRBC if <7.0 - Will consider GI consult after further workup. Limited by  participation. Psychosis (HCC) Patient largely flat this morning.  Sitter in room.  Minimally responsive.  Very guarded.  Behavior, appearance within normal limits.  Mood congruent.  Denied SI/HI yesterday.  Judgment and insight very poor.  Did not appear to be responding to internal stimuli. Patient meets criteria for inpatient psychiatry. Notes high risk of self-harm - Psychiatry preliminary recs as follows: -- Begin haldol 5mg  BID for psychosis with concurrent Cogentin for EPS control. -- Begin hydroxyzine 25mg  TID PRN for anxiety. -- Begin trazodone 50mg  PRN for sleep. -- Begin PRN agitation protocol: haldol, benadryl, ativan. - Requested capacity evaluation, Dr. Okey Dupre will join psychiatry team at 2:00 pm. - Patient is under active IVC orders from 10/9 to 10/16.  Will need to be renewed by then, if necessary. - 1:1 continuous monitoring ordered Alcohol use Patient denies withdrawal symptoms.  CIWAs continued to be negative.   - Continue CIWA protocol every 6 hours.  Consider discontinuation if CIWAs remain 0 at 48 hours. - Patient refusing thiamine supplementation as above  Chronic and Stable Issues:  Sickle cell trait: Diagnosed in 2020.  Patient had no notion that he had this disorder, appears to have been asymptomatic for most of his life.   FEN/GI: Regular PPx: SCDs Dispo: To be determined  Subjective:   No acute events overnight.  On interview, patient remains minimally cooperative with exam/interview.  Denies alcohol withdrawal symptoms.  When asked A&O questions, patient said "stop asking me that."  Tried to explain rationale behind questions.  Obviously frustrated about IVC commitment  and continued presence in the hospital.  Allowed for limited physical exam.  Objective:  BP: 123/84 HR: 60 RR: 18 T: 98.2 O2sat: 100% on room air  Significant vitals over past 24 hours:   Physical Exam:  General: Guarded, laying in emergency room bed, no acute distress Cardiovascular:  Regular rate and rhythm, no rubs or gallops.  +/- Combined systolic/diastolic murmur Respiratory: Anterior lung fields clear Abdomen: Bowel sounds present, no pain on light palpation Extremities: Unable to assess  Basic labs:  Most recent CBC Lab Results  Component Value Date   WBC 2.6 (L) 10/06/2023   HGB 7.6 (L) 10/06/2023   HCT 25.6 (L) 10/06/2023   MCV 76.2 (L) 10/06/2023   PLT 29 (LL) 10/06/2023   Most recent BMP    Latest Ref Rng & Units 10/06/2023    7:30 AM  BMP  Glucose 70 - 99 mg/dL 440   BUN 6 - 20 mg/dL 6   Creatinine 1.02 - 7.25 mg/dL 3.66   Sodium 440 - 347 mmol/L 139   Potassium 3.5 - 5.1 mmol/L 4.1   Chloride 98 - 111 mmol/L 108   CO2 22 - 32 mmol/L 24   Calcium 8.9 - 10.3 mg/dL 8.6     Other pertinent labs:  Platelets: 58 ? 29 Hemoglobin: 7.5 ? 7.6  Imaging/Diagnostic Tests:  No new imaging.  Tomie China, MD 10/06/2023, 9:25 AM  PGY-1, [facility] FPTS Intern pager: [phone], text pages welcome Secure chat group [facility] Teaching Service

## 2023-10-07 DIAGNOSIS — D696 Thrombocytopenia, unspecified: Secondary | ICD-10-CM | POA: Insufficient documentation

## 2023-10-07 DIAGNOSIS — F1090 Alcohol use, unspecified, uncomplicated: Secondary | ICD-10-CM | POA: Diagnosis not present

## 2023-10-07 DIAGNOSIS — D649 Anemia, unspecified: Secondary | ICD-10-CM | POA: Diagnosis not present

## 2023-10-07 DIAGNOSIS — F29 Unspecified psychosis not due to a substance or known physiological condition: Secondary | ICD-10-CM | POA: Diagnosis not present

## 2023-10-07 DIAGNOSIS — Z789 Other specified health status: Secondary | ICD-10-CM | POA: Diagnosis not present

## 2023-10-07 LAB — CBC
HCT: 25.9 % — ABNORMAL LOW (ref 39.0–52.0)
HCT: 26.8 % — ABNORMAL LOW (ref 39.0–52.0)
Hemoglobin: 7.7 g/dL — ABNORMAL LOW (ref 13.0–17.0)
Hemoglobin: 8.4 g/dL — ABNORMAL LOW (ref 13.0–17.0)
MCH: 22.7 pg — ABNORMAL LOW (ref 26.0–34.0)
MCH: 23.5 pg — ABNORMAL LOW (ref 26.0–34.0)
MCHC: 29.7 g/dL — ABNORMAL LOW (ref 30.0–36.0)
MCHC: 31.3 g/dL (ref 30.0–36.0)
MCV: 76.4 fL — ABNORMAL LOW (ref 80.0–100.0)
MCV: 75.1 fL — ABNORMAL LOW (ref 80.0–100.0)
Platelets: 20 10*3/uL — CL (ref 150–400)
Platelets: 18 10*3/uL — CL (ref 150–400)
RBC: 3.39 MIL/uL — ABNORMAL LOW (ref 4.22–5.81)
RBC: 3.57 MIL/uL — ABNORMAL LOW (ref 4.22–5.81)
RDW: 27.6 % — ABNORMAL HIGH (ref 11.5–15.5)
RDW: 27.9 % — ABNORMAL HIGH (ref 11.5–15.5)
WBC: 3.5 10*3/uL — ABNORMAL LOW (ref 4.0–10.5)
WBC: 3.5 10*3/uL — ABNORMAL LOW (ref 4.0–10.5)
nRBC: 1.2 % — ABNORMAL HIGH (ref 0.0–0.2)
nRBC: 1.4 % — ABNORMAL HIGH (ref 0.0–0.2)

## 2023-10-07 MED ORDER — SODIUM CHLORIDE 0.9% IV SOLUTION: Freq: Once | INTRAVENOUS | Status: AC

## 2023-10-07 NOTE — Consult Note (Signed)
[name] Psychiatry Consult Evaluation  Service Date: October 07, 2023 LOS:  LOS: 3 days   Primary Psychiatric Diagnoses  Bipolar disorder, current episode manic (r/o schizoaffective disorder bipolar type, r/o underlying medical condition contributing)  Assessment  Alfred Phillips is a 37 y.o. male admitted medically for 10/04/2023  2:51 PM for pancytopenia. He carries the psychiatric diagnoses of none and has a past medical history of sickle cell disease. Psychiatry was consulted for concern for psychosis by Tomie China, MD.  His current presentation of intermittent grandiosity, delusions, paranoia, poor sleep, increased energy, increased impulsivity in the past 2 years and worsening over the past 6 months is most consistent with either psychosis due to an underlying medical condition (his anemia, ?autoimmune process) or a primary psychiatric diagnosis of bipolar disorder, current episode manic. Unclear if he is having psychosis even when he is not in a mood episode given that he is a poor historian and the fact that he is not staying with collateral all the time. No mental health history or psychiatric hospitalizations prior to this. On initial examination, patient appears calm but he has paranoia related to his mother, disorganized thinking related to his ability to work. He minimizes and/or denies all the symptoms leading up to his hospitalization but collateral reports that prior to admission patient was  having delusions, decreased sleep, rapid speech, grandiosity, decrease in goal-directed activities. Recommend starting haldol to treat his psychosis, adding cogentin for EPS prevention given risk factors (young AA male). Please see plan below for detailed recommendations.   Diagnoses:  Active Hospital problems: Principal Problem:   Anemia of unknown etiology Active Problems:   Alcohol use   Psychosis (HCC)   Pancytopenia (HCC)   Thrombocytopenia (HCC)     Plan   ## Psychiatric  Medication Recommendations:  -- Continue haldol 5mg  BID for psychosis  -- Continue cogentin 0.5mg  daily for EPS prevention -- Continue hydroxyzine 25mg  TID PRN for anxiety  -- Continue trazodone 50mg  PRN for sleep  -- Continue PRN agitation protocol: haldol, benadryl, ativan -- Continue IM benadryl 50mg  Q6H PRN for acute dystonic reaction   ## Medical Decision Making Capacity:  -- not formally assessed   ## Further Work-up:  -- per primary   -- most recent EKG on 10/9 had QtC of 466 -- Pertinent labwork reviewed earlier this admission includes: ethanol 218, TSH wnl, UDS wnl, iron 13, %sat 3, ferritin 3, HIV NR, RPR NR, ESR/CRP wnl, CT head wnl, hepatitis panel wnl, CBC Hgb 4 -> 5.3. WBC 3. ANA negative.   ## Disposition:  -- currently meets criteria for inpatient psych  ## Behavioral / Environmental:  --   No specific recommendations at this time.   #Legal --INVOL  ## Safety and Observation Level:  - Based on my clinical evaluation, I estimate the patient to be at high risk of self harm in the current setting - At this time, we recommend a 1:1 level of observation. This decision is based on my review of the chart including patient's history and current presentation, interview of the patient, mental status examination, and consideration of suicide risk including evaluating suicidal ideation, plan, intent, suicidal or self-harm behaviors, risk factors, and protective factors. This judgment is based on our ability to directly address suicide risk, implement suicide prevention strategies and develop a safety plan while the patient is in the clinical setting. Please contact our team if there is a concern that risk level has changed.  Suicide risk assessment  Patient has following  modifiable risk factors for suicide: psychosis which we are addressing by treating his psychosis.   Patient has following non-modifiable or demographic risk factors for suicide: male gender  Patient has the  following protective factors against suicide: Supportive family, no history of suicide attempts, and no history of NSSIB  Thank you for this consult request. Recommendations have been communicated to the primary team.  We will continue to follow at this time.   Harlin Heys, DO  Psychiatric and Social History   Relevant Aspects of Hospital Course:  Admitted on 10/04/2023 for pancytopenia.  -S/p 2u PRBC  Initially brought to [facility] under IVC: "THE RESPONDENT HAS BEEN UP ALL NIGHT, HE SAYS HE DOESN'T have time to eat or sleep.  The respondent believes he is a God and the devil.  Responded has threatened to kill his family by poisoning their food.  The respondent rambles when he speaks, making nonsensical statements.  The respondent is aggressive towards me and the rest of the family.  The respondent drinks and smokes marijuana daily. IVC petitioner is Burnis Medin patient's mother." At [facility], patient stated that he was brought for "playing music at 5am" Per the police,   Patient at [facility] appeared to be labile (irritable, anxious, euthymic, guarded) and speech was clear with increased volume, periods of pauses consistent with thought blocking vs selectively mute.  In the ED, he endorsed that he had not been sleeping and he told the EDP that "I cannot die" and "I am of God." He was given IM geodon in the ED.   Patient Report 10/06/2023:  Patient is seen laying in his bed. 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 He denies any racing thoughts. 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 He initially tells this Clinical research associate his  artist name is Therapist, art, then he tells this Clinical research associate that he doesn't want that to be written down. I asked him about what was written in his IVC and he vehemently denies it, states "maybe she said those things because she doesn't want a 37 year old living in her home anymore." He reports that he just needs help with housing. He denies any racing thoughts, grandiosity, delusions. He denies that he had any trouble sleeping prior to hospitalization. He denies any issues with his appetite. Discussed that we would be starting medications and he reports he feels like he doesn't need medications because he's perfectly healthy and the only thing that he needs help with is housing. Later on, he is agreeable to taking the haldol. He denies any anxiety. He denies any past psychiatric history. He denies SI/HI/AVH.   10/07/2023 Patient seen laying in bed this afternoon accompanied by sitter at bedside. He reports that he is currently in the hospital because, "the people I live with called the police." The patient did not want to discuss any of the events that lead to his hospitalization in detail.   He reported being compliant with Haldol but not believing that the medication was necessary. He was made aware that he will be transferred to an inpatient psychiatric facility once he was medically  cleared which he also did not believe was necessary. He denies any SI/HI/AVH.  Psych ROS:  Depression: Patient denies any depressive symptoms prior to hospitalization.  Anxiety:  He also denies any anxiety symptoms. Mania: He denies any manic symptoms but reports that at times he can stay up all night writing 10 songs while also doing push-ups. Per chart review, had grandiosity, delusions of being God. Psychosis: He has some paranoia towards his mom. He denies having delusions, thought insertion, withdrawal, or broadcasting. He denies AVH.   Collateral information:  Contacted mom Burnis Medin at [phone] on  10/06/23 Reports usually he likes to drink. He drinks alcohol for a long time. But then he was saying he was talking saying he was God that he can kill you. He sometimes tells her he is not his mom. He wants daddy to tell her he is not his mom. Reports he started to insult his uncle. Reports he has a history of trespassing until 6am. Reports he was playing his music until the night. He used to be able to cook, everything. He will talk and worship. He will sometimes say he is God and everything belongs to him, reports this behavior has been going on for 2 years, 6 months ago it got worse, violence and threats. Drinking, not sleeping. Rapid speech. He's fighting with her and his siblings. No history of mental health. He didn't want to go to hospital. When he's not drinking, 3-4 bottles of wine or gin, unsure of frequency. He is intermittently at home. on the streets, and in jail. When he also drinks gets really violent. He can walk 30 miles to look for a drink. If he don't drink, he doesn't fight anybody. If he drinks, he will state that he can help you go to hell. He's also said that he can poison your food. Reports they called the police on him because at night he didn't sleep because he kept talking loudly, walking around, playing music, saying things that didn't make sense, how he was in jail and he goes intentionally, he's better off in jail. Stating if family is not careful, he will help [place] go to jail. He told mom that she can go to [place]. It's only dead people that are free. Night before that he wasn't at home, he was just released from jail. Will do same to people outside and then they will call him for harrassment. He will also get arrested for stealing because he thinks everything belongs to him because he is God. Don't know access to gun.   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 Lives with mother, is intermittently homeless. Moved to [place] from [place] Considers support system to be God. Denies having people that support him.   Access to weapons: denies   Substance History Tobacco use: yes, 1/2 ppd. Declines tobacco replacement therapy  Alcohol use: reports drinking  sometimes, whenever I get it. No cravings, no withdrawals. Is not forthcoming with how much he is drinking when not in hospital  Cannabis: denies   Exam Findings   Psychiatric Specialty Exam:  Presentation  General Appearance: Appropriate for Environment  Eye Contact:Fair  Speech:Normal Rate  Speech Volume:Normal  Handedness:Right   Mood and Affect  Mood:Irritable  Affect:Congruent   Thought Process  Thought Processes:Disorganized  Descriptions of Associations:Intact  Orientation:Full (Time, Place and Person)  Thought Content:Illogical  Hallucinations:Hallucinations: None  Ideas of Reference:None  Suicidal Thoughts:Suicidal Thoughts: No  Homicidal Thoughts:Homicidal Thoughts: No   Sensorium  Memory:Immediate Poor  Judgment:Poor  Insight:Poor   Executive Functions  Concentration:Good  Attention Span:Good  Recall:Poor  Fund of Knowledge:Poor  Language:Good   Psychomotor Activity  Psychomotor Activity:Psychomotor Activity: Normal   Assets  Assets:Communication Skills; Desire for Improvement; Social Support   Sleep   Sleep:Sleep: Good    Physical Exam: Vital signs:  Temp:  [98.1 F (36.7 C)-98.8 F (37.1 C)] 98.1 F (36.7 C) (10/12 0426) Pulse Rate:  [64] 64 (10/12 0426) Resp:  [16-17] 17 (10/12 0426) BP: (123-140)/(84-94) 140/94 (10/12 0426) SpO2:  [100 %] 100 % (10/12 0426) Physical Exam Constitutional:      Appearance: Normal appearance.  HENT:     Head: Normocephalic and atraumatic.  Pulmonary:     Effort: Pulmonary effort is normal.  Skin:    General: Skin is warm and dry.  Neurological:     General: No focal deficit present.     Mental Status: He is alert.  Psychiatric:        Mood and Affect: Affect is labile.        Behavior: Behavior is cooperative.        Thought Content: Thought content is paranoid.     Comments: Speech can be normal rate but become more rapid  Cooperative but can get agitated easily     Blood pressure (!) 140/94, pulse 64, temperature 98.1 F (36.7 C), temperature source Oral, resp. rate 17, height 5\' 8"  (1.727 m), weight 63.5 kg, SpO2 100%. Body mass index is 21.29 kg/m.   Other History   These have been pulled in through the EMR, reviewed, and updated if appropriate.   Family History:  None The patient's family history is not on file.  Medical History: Past medical history of sickle cell trait   Surgical History: None  Medications:   Current Facility-Administered Medications:    benztropine (COGENTIN) tablet 0.5 mg, 0.5 mg, Oral, Daily, Karie Fetch, MD, 0.5 mg at 10/07/23 0941   haloperidol (HALDOL) tablet 5 mg, 5 mg, Oral, TID PRN **AND** LORazepam (ATIVAN) tablet 2 mg, 2 mg, Oral, TID PRN **AND** diphenhydrAMINE (BENADRYL) capsule 50 mg, 50 mg, Oral, TID PRN, Massengill, Harrold Donath, MD   haloperidol lactate (HALDOL) injection 5 mg, 5 mg, Intramuscular, TID PRN **AND** LORazepam (ATIVAN) injection 2 mg, 2 mg, Intramuscular, TID PRN **AND** diphenhydrAMINE (BENADRYL) injection 50 mg, 50 mg, Intramuscular, TID PRN, Massengill, Harrold Donath,  MD   diphenhydrAMINE (BENADRYL) injection 50 mg, 50 mg, Intramuscular, Q6H PRN, Karie Fetch, MD   ferrous sulfate tablet 325 mg, 325 mg, Oral, QODAY, Dahbura, Anton, DO, 325 mg at 10/07/23 0941   folic acid (FOLVITE) tablet 1 mg, 1 mg, Oral, Daily, 1 mg at 10/07/23 0941 **OR** folic acid 1 mg in sodium chloride 0.9 % 50 mL IVPB, 1 mg, Intravenous, Daily, Tomie China, MD, Last Rate: 100.4 mL/hr at 10/06/23 1437, 1 mg at 10/06/23 1437   haloperidol (HALDOL)  tablet 5 mg, 5 mg, Oral, Q12H, Massengill, Nathan, MD, 5 mg at 10/07/23 0941   hydrOXYzine (ATARAX) tablet 25 mg, 25 mg, Oral, TID PRN, Massengill, Harrold Donath, MD   multivitamin with minerals tablet 1 tablet, 1 tablet, Oral, Daily, Shelby Mattocks, DO, 1 tablet at 10/07/23 0941   thiamine (VITAMIN B1) tablet 100 mg, 100 mg, Oral, Daily, 100 mg at 10/07/23 0941 **OR** thiamine (VITAMIN B1) injection 100 mg, 100 mg, Intravenous, Daily, Dahbura, Anton, DO   traZODone (DESYREL) tablet 50 mg, 50 mg, Oral, QHS PRN, Phineas Inches, MD  Allergies: No Known Allergies

## 2023-10-07 NOTE — Assessment & Plan Note (Addendum)
Leading etiology: Severe iron deficiency.  Unlikely related to previous diagnosis of sickle cell trait.  Hemoglobin 7.6 ? 7.7.  S/p 4 units PRBC.   - Continue to trend CBCs qDay and monitor for frank bleeding.  - Transfuse pRBCs @ <7.0.  Stabilizing - Per heme recs, we will consider GI consult after patient stabilized.

## 2023-10-07 NOTE — Plan of Care (Signed)
Plan of care note:  CBC @1553  showed platelets 29 ? 20 ? 18.  On hematology consult 10/10, Dr. Leonides Schanz recommended repletion of platelets <20.  Called on-duty hematologist Dr. Cherly Hensen earlier this afternoon -- he agreed with repletion of 1 unit IV platelets.  Family medicine attending physician Dr. Lum Babe made aware at that time, also in agreement.  Order placed.  We will repeat CBC in the a.m. 10/13.  Luiz Iron, MD Psychiatry resident, PGY 1

## 2023-10-07 NOTE — Plan of Care (Signed)
Problem: Activity: Goal: Risk for activity intolerance will decrease Outcome: Progressing   Problem: Nutrition: Goal: Adequate nutrition will be maintained Outcome: Progressing   Problem: Coping: Goal: Level of anxiety will decrease Outcome: Progressing   Problem: Safety: Goal: Ability to remain free from injury will improve Outcome: Progressing   Problem: Skin Integrity: Goal: Risk for impaired skin integrity will decrease Outcome: Progressing

## 2023-10-07 NOTE — Assessment & Plan Note (Addendum)
Patient compliant with PO Haldol/Cogentin yesterday.  Refusing all other PO medications.  No PRNs needed up to this point. Today,  Patient appeared at baseline.  Somewhat tired.  Per psych, presentation is most consistent with bipolar disorder versus schizoaffective disorder bipolar type.  For further detail, see collateral call with mother and psychiatry progress note 10/11.  +/- Personality component. - Psychiatry recs as follows:  -- Begin haldol 5mg  BID for psychosis with concurrent Cogentin for EPS control. -- Begin hydroxyzine 25mg  TID PRN for anxiety. -- Begin trazodone 50mg  PRN for sleep. -- Begin PRN agitation protocol: haldol, benadryl, ativan. - Patient has capacity to refuse treatment/workup, including IV platelets.  - Patient is under active IVC orders from 10/9 to 10/16.  Will need to be renewed by then, if necessary. - 1:1 continuous monitoring ordered

## 2023-10-07 NOTE — Progress Notes (Signed)
MEWS Progress Note  Patient Details Name: Alfred Phillips MRN: [id number] DOB: [birth year] Today's Date: 10/07/2023   MEWS Flowsheet Documentation:  Assess: MEWS Score Temp: 98.1 F (36.7 C) BP: (!) 140/94 MAP (mmHg): 106 Pulse Rate: 64 ECG Heart Rate: 63 Resp: 17 Level of Consciousness: Alert SpO2: 100 % O2 Device: Room Air Patient Activity (if Appropriate): In bed Assess: MEWS Score MEWS Temp: 0 MEWS Systolic: 0 MEWS Pulse: 0 MEWS RR: 0 MEWS LOC: 0 MEWS Score: 0 MEWS Score Color: Green Assess: SIRS CRITERIA SIRS Temperature : 0 SIRS Respirations : 0 SIRS Pulse: 0 SIRS WBC: 0 SIRS Score Sum : 0 SIRS Temperature : 0 SIRS Pulse: 0 SIRS Respirations : 0 SIRS WBC: 1 SIRS Score Sum : 1        [name] 10/07/2023, 11:07 AM

## 2023-10-07 NOTE — Progress Notes (Addendum)
Daily Progress Note Intern Pager: [phone]  Patient name: Alfred Phillips Medical record number: [id number] Date of birth: [birth year] Age: 37 y.o. Gender: male  Primary Care Provider: Pcp, No Consultants: Hematology, psychiatry Code Status: Full  Pt Overview and Major Events to Date:   Alfred Phillips is a 37 year old male with a history of sickle cell trait and poor medical follow-up who presents to the [facility] emergency department under IVC for psychosis, manic symptoms, and pancytopenia.  Per chart review, he was playing very loud music, staying up all night, saying that he was "a God and the devil". EtOH: 218.  UDS negative.  Received IM Geodon 20 mg as needed x1 for agitation and found to have a hemoglobin of 4.0 ? 3.5 on admission to the [facility]. CBC and iron labs indicate iron deficiency anemia. Now s/p 4u pRBC. Notably, in 2020, patient was admitted to the [facility] emergency department with a hemoglobin of 5.5 found to be similarly iron deficient. S/p IV iron 10/10.    Thrombocytopenic to 23 ? 20.  Per psychiatry, patient has capacity to refuse medical workup/treatment. Difficult situation if patient decides to refuse medical treatment for his IDA/thrombocytopenia. We will not be able to remain primary team if patient begins to continuously refuse all medical treatment. Thankfully, this does not seem to be the case at this time, as he has been taking his oral medications 10/12 without complaint.  Appears less irritable today.  Under IVC until 10/16. Assessment & Plan Anemia of unknown etiology Leading etiology: Severe iron deficiency.  Unlikely related to previous diagnosis of sickle cell trait.  Hemoglobin 7.6 ? 7.7.  S/p 4 units PRBC.   - Continue to trend CBCs qDay and monitor for frank bleeding.  - Transfuse pRBCs @ <7.0.  Stabilizing - Per heme recs, we will consider GI consult after patient stabilized. Thrombocytopenia (HCC) Thrombocytopenic to 23 ? 20.  Per hematology, this can also be  seen in severe iron deficiency anemia.   - Transfuse platelets @ <20 platelets.  Will recheck CBC at 4 PM.  Will reach out to hematology for next steps if necessary. - If thrombocytopenia fails to improve 4-5 days after iron infusion (10/10), hematology recommends bone marrow biopsy for further workup.  Patient is currently refusing p.o. iron.  Psychosis (HCC) Patient compliant with PO Haldol/Cogentin yesterday.  Refusing all other PO medications.  No PRNs needed up to this point. Today,  Patient appeared at baseline.  Somewhat tired.  Per psych, presentation is most consistent with bipolar disorder versus schizoaffective disorder bipolar type.  For further detail, see collateral call with mother and psychiatry progress note 10/11.  +/- Personality component. - Psychiatry recs as follows:  -- Begin haldol 5mg  BID for psychosis with concurrent Cogentin for EPS control. -- Begin hydroxyzine 25mg  TID PRN for anxiety. -- Begin trazodone 50mg  PRN for sleep. -- Begin PRN agitation protocol: haldol, benadryl, ativan. - Patient has capacity to refuse treatment/workup, including IV platelets.  - Patient is under active IVC orders from 10/9 to 10/16.  Will need to be renewed by then, if necessary. - 1:1 continuous monitoring ordered Alcohol use Does not appear to exhibit any sign of alcohol withdrawal.  Continued negative CIWAs. - Discontinue CIWAs tomorrow if they remain 0 over the next 24 hours. - Patient now accepting folate and thiamine supplementation.  Chronic and Stable Issues:  Sickle cell trait: Asymptomatic at baseline.  FEN/GI: Regular PPx: SCDs Dispo: TBD  Subjective:   Patient's platelets found  to be under 20.  Otherwise, no acute events.  On interview, denies new symptoms.  No lightheadedness.  No pain.  Was sleepy, allowed for limited physical exam.  Objective:  BP: 140/94 HR: 16 RR: 19 T: 98.1 O2sat: 100% on room air  Significant vitals over past 24 hours: None  Physical  Exam:  General: Guarded, lying in bed, no acute distress Cardiovascular: Regular rate and rhythm, no rubs or gallops Respiratory: Anterior lung fields clear Abdomen: Unable to assess Extremities: Unable to assess  Basic labs:  Most recent CBC Lab Results  Component Value Date   WBC 3.5 (L) 10/07/2023   HGB 7.7 (L) 10/07/2023   HCT 25.9 (L) 10/07/2023   MCV 76.4 (L) 10/07/2023   PLT 20 (LL) 10/07/2023   Most recent BMP    Latest Ref Rng & Units 10/06/2023    7:30 AM  BMP  Glucose 70 - 99 mg/dL 782   BUN 6 - 20 mg/dL 6   Creatinine 9.56 - 2.13 mg/dL 0.86   Sodium 578 - 469 mmol/L 139   Potassium 3.5 - 5.1 mmol/L 4.1   Chloride 98 - 111 mmol/L 108   CO2 22 - 32 mmol/L 24   Calcium 8.9 - 10.3 mg/dL 8.6     Other pertinent labs:  Platelets: 23 ? 20 Hemoglobin: 7.6 ? 7.7  Imaging/Diagnostic Tests:  No new imaging.  Alfred China, MD 10/07/2023, 7:38 AM  PGY-1, [facility] Family [facility] FPTS Intern pager: [phone], text pages welcome Secure chat group [facility] Teaching Service

## 2023-10-07 NOTE — Assessment & Plan Note (Addendum)
Thrombocytopenic to 23 ? 20.  Per hematology, this can also be seen in severe iron deficiency anemia.   - Transfuse platelets @ <20 platelets.  Will recheck CBC at 4 PM.  Will reach out to hematology for next steps if necessary. - If thrombocytopenia fails to improve 4-5 days after iron infusion (10/10), hematology recommends bone marrow biopsy for further workup.  Patient is currently refusing p.o. iron.

## 2023-10-07 NOTE — Assessment & Plan Note (Addendum)
Does not appear to exhibit any sign of alcohol withdrawal.  Continued negative CIWAs. - Discontinue CIWAs tomorrow if they remain 0 over the next 24 hours. - Patient now accepting folate and thiamine supplementation.

## 2023-10-08 LAB — CBC WITH DIFFERENTIAL/PLATELET
Abs Immature Granulocytes: 0.02 10*3/uL (ref 0.00–0.07)
Abs Immature Granulocytes: 0.04 10*3/uL (ref 0.00–0.07)
Basophils Absolute: 0.1 10*3/uL (ref 0.0–0.1)
Basophils Absolute: 0.1 10*3/uL (ref 0.0–0.1)
Basophils Relative: 2 %
Basophils Relative: 1 %
Eosinophils Absolute: 0.2 10*3/uL (ref 0.0–0.5)
Eosinophils Absolute: 0.1 10*3/uL (ref 0.0–0.5)
Eosinophils Relative: 3 %
Eosinophils Relative: 4 %
HCT: 26.4 % — ABNORMAL LOW (ref 39.0–52.0)
HCT: 29.1 % — ABNORMAL LOW (ref 39.0–52.0)
Hemoglobin: 7.7 g/dL — ABNORMAL LOW (ref 13.0–17.0)
Hemoglobin: 8.3 g/dL — ABNORMAL LOW (ref 13.0–17.0)
Immature Granulocytes: 0 %
Immature Granulocytes: 1 %
Lymphocytes Relative: 27 %
Lymphocytes Relative: 33 %
Lymphs Abs: 1.3 10*3/uL (ref 0.7–4.0)
Lymphs Abs: 1.2 10*3/uL (ref 0.7–4.0)
MCH: 22.3 pg — ABNORMAL LOW (ref 26.0–34.0)
MCH: 22 pg — ABNORMAL LOW (ref 26.0–34.0)
MCHC: 29.2 g/dL — ABNORMAL LOW (ref 30.0–36.0)
MCHC: 28.5 g/dL — ABNORMAL LOW (ref 30.0–36.0)
MCV: 76.3 fL — ABNORMAL LOW (ref 80.0–100.0)
MCV: 77 fL — ABNORMAL LOW (ref 80.0–100.0)
Monocytes Absolute: 0.6 10*3/uL (ref 0.1–1.0)
Monocytes Absolute: 0.1 10*3/uL (ref 0.1–1.0)
Monocytes Relative: 11 %
Monocytes Relative: 3 %
Neutro Abs: 2.8 10*3/uL (ref 1.7–7.7)
Neutro Abs: 2.1 10*3/uL (ref 1.7–7.7)
Neutrophils Relative %: 57 %
Neutrophils Relative %: 58 %
Platelets: 15 10*3/uL — CL (ref 150–400)
Platelets: 16 10*3/uL — CL (ref 150–400)
RBC: 3.46 MIL/uL — ABNORMAL LOW (ref 4.22–5.81)
RBC: 3.78 MIL/uL — ABNORMAL LOW (ref 4.22–5.81)
RDW: 28.4 % — ABNORMAL HIGH (ref 11.5–15.5)
RDW: 28.1 % — ABNORMAL HIGH (ref 11.5–15.5)
WBC: 5 10*3/uL (ref 4.0–10.5)
WBC: 3.6 10*3/uL — ABNORMAL LOW (ref 4.0–10.5)
nRBC: 1 % — ABNORMAL HIGH (ref 0.0–0.2)
nRBC: 1.7 % — ABNORMAL HIGH (ref 0.0–0.2)

## 2023-10-08 LAB — CBC
HCT: 26.1 % — ABNORMAL LOW (ref 39.0–52.0)
Hemoglobin: 7.8 g/dL — ABNORMAL LOW (ref 13.0–17.0)
MCH: 22.7 pg — ABNORMAL LOW (ref 26.0–34.0)
MCHC: 29.9 g/dL — ABNORMAL LOW (ref 30.0–36.0)
MCV: 75.9 fL — ABNORMAL LOW (ref 80.0–100.0)
Platelets: 12 10*3/uL — CL (ref 150–400)
RBC: 3.44 MIL/uL — ABNORMAL LOW (ref 4.22–5.81)
RDW: 27.9 % — ABNORMAL HIGH (ref 11.5–15.5)
WBC: 6.6 10*3/uL (ref 4.0–10.5)
nRBC: 0.5 % — ABNORMAL HIGH (ref 0.0–0.2)

## 2023-10-08 LAB — BPAM PLATELET PHERESIS
Blood Product Expiration Date: 202410122359
ISSUE DATE / TIME: 202410121945
Unit Type and Rh: 5100

## 2023-10-08 LAB — PREPARE PLATELET PHERESIS: Unit division: 0

## 2023-10-08 LAB — FOLATE: Folate: 14.6 ng/mL (ref >5.9)

## 2023-10-08 MED ORDER — SODIUM CHLORIDE 0.9% IV SOLUTION: Freq: Once | INTRAVENOUS | Status: AC

## 2023-10-08 MED ORDER — SODIUM CHLORIDE 0.9 % IV SOLN: 1.0000 mg | Freq: Every day | INTRAVENOUS | Status: DC

## 2023-10-08 MED ORDER — FOLIC ACID 1 MG PO TABS
2.0000 mg | ORAL_TABLET | Freq: Every day | ORAL | Status: DC
  Administered 2023-10-08 – 2023-10-11 (×4): 2 mg via ORAL
  Filled 2023-10-08 (×4): qty 2

## 2023-10-08 NOTE — Assessment & Plan Note (Addendum)
Leading etiology is severe iron deficiency per heme/onc. S/p feraheme infusion 10/10. Unlikely related to previous diagnosis of sickle cell trait. No obvious signs of active bleeding, unable to complete rectal exam or collect FOBT due to psychotic state. Hemoglobin stable at 7.8 this AM.  -- Heme/onc recommends increasing folate supplementation to 2mg  and checking folic acid level - Continue to trend CBCs qDay and monitor for frank bleeding - Transfuse pRBCs @ <7.0 - Consider GI consult after patient stabilized to evaluate for GI bleed vs malignancy -- Oral Fe supplementation

## 2023-10-08 NOTE — Progress Notes (Signed)
Daily Progress Note Intern Pager: [phone]  Patient name: Alfred Phillips Medical record number: [id number] Date of birth: [birth year] Age: 37 y.o. Gender: male  Primary Care Provider: Pcp, No Consultants: heme/onc, psych Code Status: full  Pt Overview and Major Events to Date:  10/9: admitted, 2u pRBCs 10/10: 2u pRBCs 10/12: 1u platelets 10/13: 1u platelets  Assessment and Plan: Alfred Phillips is a 37 year old male admitted for acute psychosis (under IVC until 10/16) and pancytopenia possibly secondary to severe iron deficiency anemia.   Negative labs for various etiologies of pancytopenia, including Hepatitis/HIV/RPR, sed rate/CRP, B12, and ANA. Hemoglobin improved and stable >7 after 4 units of blood and IV iron infusion with no obvious signs of active bleeding. Platelets steadily decreasing, receiving second platelet transfusion today. Pending bone marrow biopsy to investigate possibly heme malignancy. Leukocytes improved to normal.   Psychiatry following and treating psychosis with haldol and cogentin. Deemed to have capacity to refuse medical treatment workup. Presentation is most consistent with bipolar disorder versus schizoaffective disorder bipolar type.  For further detail, see collateral call with mother and psychiatry progress note 10/11. Pt has not required PRN psychiatry medications during admission.Marland Kitchen  PMH: sickle cell trait Assessment & Plan Anemia of unknown etiology Leading etiology is severe iron deficiency per heme/onc. S/p feraheme infusion 10/10. Unlikely related to previous diagnosis of sickle cell trait. No obvious signs of active bleeding, unable to complete rectal exam or collect FOBT due to psychotic state. Hemoglobin stable at 7.8 this AM.  -- Heme/onc recommends increasing folate supplementation to 2mg  and checking folic acid level - Continue to trend CBCs qDay and monitor for frank bleeding - Transfuse pRBCs @ <7.0 - Consider GI consult after patient stabilized  to evaluate for GI bleed vs malignancy -- Oral Fe supplementation Thrombocytopenia (HCC) Thrombocytopenic to 29 ? 20 ? 18 ? 12.  Per hematology, this can also be seen in severe iron deficiency anemia and unlikely immune mediated as this would be an isolated thrombocytopenia. -- Heme/onc recommendations: --platelet transfusion threshold <20  -- likely will need bone marrow biopsy prior to considering steroids or IVIG --f/u post transfusion CBC  Psychosis (HCC) Pt got 5mg  haldol BID and cogentin 0.5mg  daily yesterday, no PRNs required. Today,  patient's mental status is much improved, he does not appear to be actively psychotic.    - Psychiatry recs as follows:  -- Continue haldol 5mg  BID for psychosis with concurrent Cogentin 0.5mg  for EPS control. -- Continue hydroxyzine 25mg  TID PRN for anxiety. -- Continue trazodone 50mg  PRN for sleep. -- Continue PRN agitation protocol: haldol, benadryl, ativan. - Patient has capacity to refuse treatment/workup, including IV platelets.  - Patient is under active IVC orders from 10/9 to 10/16.  Will need to be renewed by then, if necessary. - 1:1 continuous monitoring ordered Alcohol use Does not appear to exhibit any sign of alcohol withdrawal.  CIWAs have been zero throughout admission. -- D/C CIWA - Folate (as above) and thiamine supplementation.    FEN/GI: regular PPx: SCDs given ongoing bleed Dispo: pending clinical improvement   Subjective:  Patient states he is doing fine today.  Discussed concerns about low platelets and possible need for another unit, patient gave consent for this. Denies rash, bruising, or blood in urine/stool/phlegm  Also discussed possible need for future bone marrow biopsy, patient said "I don't wanna to do all that." Pt calm and appropriate throughout interaction.  Objective: Temp:  [98.8 F (37.1 C)-99.2 F (37.3 C)] 98.8 F (  37.1 C) (10/13 0650) Pulse Rate:  [69-77] 73 (10/13 0650) Resp:  [18] 18 (10/13  0408) BP: (103-127)/(67-87) 110/67 (10/13 0650) SpO2:  [99 %-100 %] 99 % (10/13 0650) Physical Exam: General: Young man sitting up in bed, appropriate and cooperative with exam, in no acute distress Cardiovascular: RRR, normal S1/S2, no murmurs, rubs, gallops Respiratory: CTAB, normal WOB on RA Abdomen: normoactive bowel sounds, soft, nontender, nondistended Extremities: no edema to BLE Skin: no petechiae or bruising on face, chest, or extremities Psych: calm affect, normal eye contact, normal rate and volume of speech  Laboratory: Most recent CBC Lab Results  Component Value Date   WBC 6.6 10/08/2023   HGB 7.8 (L) 10/08/2023   HCT 26.1 (L) 10/08/2023   MCV 75.9 (L) 10/08/2023   PLT 12 (LL) 10/08/2023   Most recent BMP    Latest Ref Rng & Units 10/06/2023    7:30 AM  BMP  Glucose 70 - 99 mg/dL 409   BUN 6 - 20 mg/dL 6   Creatinine 8.11 - 9.14 mg/dL 7.82   Sodium 956 - 213 mmol/L 139   Potassium 3.5 - 5.1 mmol/L 4.1   Chloride 98 - 111 mmol/L 108   CO2 22 - 32 mmol/L 24   Calcium 8.9 - 10.3 mg/dL 8.6      Imaging/Diagnostic Tests: None  Alfred Bender, MD 10/08/2023, 8:07 AM  PGY-1, [place] [facility] FPTS Intern pager: [phone], text pages welcome Secure chat group [facility] Teaching Service

## 2023-10-08 NOTE — Assessment & Plan Note (Addendum)
Pt got 5mg  haldol BID and cogentin 0.5mg  daily yesterday, no PRNs required. Today,  patient's mental status is much improved, he does not appear to be actively psychotic.    - Psychiatry recs as follows:  -- Continue haldol 5mg  BID for psychosis with concurrent Cogentin 0.5mg  for EPS control. -- Continue hydroxyzine 25mg  TID PRN for anxiety. -- Continue trazodone 50mg  PRN for sleep. -- Continue PRN agitation protocol: haldol, benadryl, ativan. - Patient has capacity to refuse treatment/workup, including IV platelets.  - Patient is under active IVC orders from 10/9 to 10/16.  Will need to be renewed by then, if necessary. - 1:1 continuous monitoring ordered

## 2023-10-08 NOTE — Progress Notes (Signed)
Patient received 1 Unit of platelets this shift. Patient tolerated well, no acute adverse reactions noted. Will continue to monitor.

## 2023-10-08 NOTE — Assessment & Plan Note (Addendum)
Thrombocytopenic to 29 ? 20 ? 18 ? 12.  Per hematology, this can also be seen in severe iron deficiency anemia and unlikely immune mediated as this would be an isolated thrombocytopenia. -- Heme/onc recommendations: --platelet transfusion threshold <20  -- likely will need bone marrow biopsy prior to considering steroids or IVIG --f/u post transfusion CBC

## 2023-10-08 NOTE — Assessment & Plan Note (Signed)
Does not appear to exhibit any sign of alcohol withdrawal.  CIWAs have been zero throughout admission. -- D/C CIWA - Folate (as above) and thiamine supplementation.

## 2023-10-09 ENCOUNTER — Inpatient Hospital Stay (HOSPITAL_COMMUNITY): Payer: 59

## 2023-10-09 DIAGNOSIS — D500 Iron deficiency anemia secondary to blood loss (chronic): Secondary | ICD-10-CM

## 2023-10-09 DIAGNOSIS — D61818 Other pancytopenia: Secondary | ICD-10-CM

## 2023-10-09 DIAGNOSIS — D649 Anemia, unspecified: Secondary | ICD-10-CM | POA: Diagnosis not present

## 2023-10-09 DIAGNOSIS — F29 Unspecified psychosis not due to a substance or known physiological condition: Secondary | ICD-10-CM | POA: Diagnosis not present

## 2023-10-09 LAB — BPAM PLATELET PHERESIS
Blood Product Expiration Date: 202410142359
Blood Product Expiration Date: 202410142359
ISSUE DATE / TIME: 202410130637
ISSUE DATE / TIME: 202410131529
Unit Type and Rh: 6200
Unit Type and Rh: 6200

## 2023-10-09 LAB — CULTURE, BLOOD (ROUTINE X 2)
Culture: NO GROWTH
Culture: NO GROWTH
Special Requests: ADEQUATE
Special Requests: ADEQUATE

## 2023-10-09 LAB — PREPARE PLATELET PHERESIS
Unit division: 0
Unit division: 0

## 2023-10-09 LAB — CBC
HCT: 29.1 % — ABNORMAL LOW (ref 39.0–52.0)
Hemoglobin: 8.4 g/dL — ABNORMAL LOW (ref 13.0–17.0)
MCH: 22.3 pg — ABNORMAL LOW (ref 26.0–34.0)
MCHC: 28.9 g/dL — ABNORMAL LOW (ref 30.0–36.0)
MCV: 77.4 fL — ABNORMAL LOW (ref 80.0–100.0)
Platelets: 16 10*3/uL — CL (ref 150–400)
RBC: 3.76 MIL/uL — ABNORMAL LOW (ref 4.22–5.81)
RDW: 28.4 % — ABNORMAL HIGH (ref 11.5–15.5)
WBC: 6.4 10*3/uL (ref 4.0–10.5)
nRBC: 0.9 % — ABNORMAL HIGH (ref 0.0–0.2)

## 2023-10-09 LAB — RETICULOCYTES
Immature Retic Fract: 16.6 % — ABNORMAL HIGH (ref 2.3–15.9)
RBC.: 3.85 MIL/uL — ABNORMAL LOW (ref 4.22–5.81)
Retic Count, Absolute: 47 10*3/uL (ref 19.0–186.0)
Retic Ct Pct: 1.2 % (ref 0.4–3.1)

## 2023-10-09 LAB — IMMATURE PLATELET FRACTION: Immature Platelet Fraction: 12.9 % — ABNORMAL HIGH (ref 1.2–8.6)

## 2023-10-09 MED ORDER — ACETAMINOPHEN 500 MG PO TABS
1000.0000 mg | ORAL_TABLET | Freq: Four times a day (QID) | ORAL | Status: DC | PRN
  Administered 2023-10-09 – 2023-10-10 (×2): 1000 mg via ORAL
  Filled 2023-10-09 (×2): qty 2

## 2023-10-09 MED ORDER — DICLOFENAC SODIUM 1 % EX GEL
2.0000 g | Freq: Four times a day (QID) | CUTANEOUS | Status: DC | PRN
  Filled 2023-10-09: qty 100

## 2023-10-09 MED ORDER — SODIUM CHLORIDE 0.9% IV SOLUTION: Freq: Once | INTRAVENOUS | Status: AC

## 2023-10-09 NOTE — Assessment & Plan Note (Addendum)
Leading etiology is severe iron deficiency per heme/onc. S/p feraheme infusion 10/10. Unlikely related to previous diagnosis of sickle cell trait. No obvious signs of active bleeding. Hemoglobin stable at 8.4 this AM.  -- Continue folate supplementation to 2mg  and checking folic acid level - Continue to trend CBCs qDay and monitor for frank bleeding - Transfuse pRBCs @ <7.0 - Consider GI consult after patient stabilized to evaluate for GI bleed vs malignancy - Oral Fe supplementation

## 2023-10-09 NOTE — Plan of Care (Signed)

## 2023-10-09 NOTE — Progress Notes (Addendum)
Daily Progress Note Intern Pager: [phone]  Patient name: Alfred Phillips Medical record number: [id number] Date of birth: 18-Dec-1986 Age: 37 y.o. Gender: male  Primary Care Provider: Pcp, No Consultants: Hematology/oncology, psychiatry Code Status: Full  Pt Overview and Major Events to Date:  10/9: admitted, 2u pRBCs 10/10: 2u pRBCs 10/12: 1u platelets 10/13: 2u platelets  Assessment and Plan: Alfred Phillips is a 37 year old male admitted for acute psychosis (under IVC until 10/16) and pancytopenia possibly secondary to severe iron deficiency anemia.    Negative labs for various etiologies of pancytopenia, including Hepatitis/HIV/RPR, sed rate/CRP, B12, and ANA. Hemoglobin improved and stable >7 after 4 units of blood and IV iron infusion with no obvious signs of active bleeding. Platelets steadily decreasing, receiving second platelet transfusion today. Pending bone marrow biopsy to investigate possibly heme malignancy. Leukocytes improved to normal.    Psychiatry following and treating psychosis with haldol and cogentin. Deemed to have capacity to refuse medical treatment workup. Presentation is most consistent with bipolar disorder versus schizoaffective disorder bipolar type.  For further detail, see collateral call with mother and psychiatry progress note 10/11. Pt has not required PRN psychiatry medications during admission.  Continuing pancytopenia workup.  Patient has capacity to make medical decisions and does not want colonoscopy/bone marrow biopsy, limiting options.  IVC until 10/16.   PMH: sickle cell trait Assessment & Plan Thrombocytopenia (HCC) Thrombocytopenic to 20 ? 18 ? 12 ? 16.  S/p 3 units of platelets.  Immature platelet fraction 12.9%, high.  Indicates increased platelet production.  Per hematology, going forward threshold for platelet transfusion is <10. - Appreciate further hematology recommendations. - Awaiting haptoglobin/reticulocytes/immature platelet fraction -  Ordered splenic ultrasound to rule out sequestration picture ISO unmoving platelet levels after 3 units given - Follow-up a.m. CBC/BMP 10/15 - Platelet transfusion threshold <20 ? <10 - Likely will need bone marrow biopsy prior to considering steroids or IVIG Anemia of unknown etiology Leading etiology is severe iron deficiency per heme/onc. S/p feraheme infusion 10/10. Unlikely related to previous diagnosis of sickle cell trait. No obvious signs of active bleeding. Hemoglobin stable at 8.4 this AM.  -- Continue folate supplementation to 2mg  and checking folic acid level - Continue to trend CBCs qDay and monitor for frank bleeding - Transfuse pRBCs @ <7.0 - Consider GI consult after patient stabilized to evaluate for GI bleed vs malignancy - Oral Fe supplementation Psychosis (HCC) Patient continues to take his psychiatric medications.  Patient appears at psychiatric baseline.  Very poor insight into manic symptomatology. See psychiatry progress note for further detail. - Psychiatry recs as follows:  -- Continue haldol 5mg  BID for psychosis with concurrent Cogentin 0.5mg  for EPS control. -- Continue hydroxyzine 25mg  TID PRN for anxiety. -- Continue trazodone 50mg  PRN for sleep. -- Continue PRN agitation protocol: haldol, benadryl, ativan. - Patient has capacity to refuse treatment/workup, including IV platelets.  - Patient is under active IVC orders from 10/9 to 10/16.  Will need to be renewed by then, if necessary. - 1:1 continuous monitoring ordered Alcohol use (Resolved: 10/09/2023) Does not appear to exhibit any sign of alcohol withdrawal.  CIWAs have been zero throughout admission. -- D/C CIWA - Folate (as above) and thiamine supplementation.  FEN/GI: Regular PPx: SCDs Dispo: Pending clinical improvement  Subjective:   No acute events overnight.  On interview, patient was sleepy.  Allowed for limited physical exam.  No new complaints.  Understands hematology will be seeing him  later today.  Objective:  BP: 96/61  HR: 87 RR: 16 T: 98.6 O2sat: 100 percent on room air  Significant vitals over past 24 hours: HR: 52, BP: 150s over 100s  Physical Exam:  General: Well-appearing male sleeping in his bed, awakes to voice, interactive/participatory Cardiovascular: RRR no m/r/g. Respiratory: CTAB. No w/r/r.  Abdomen: No tenderness in 4 quadrants. BS+.  Spleen not palpated Extremities: ROM intact.  Nonedematous.  Basic labs:  Most recent CBC Lab Results  Component Value Date   WBC 6.4 10/09/2023   HGB 8.4 (L) 10/09/2023   HCT 29.1 (L) 10/09/2023   MCV 77.4 (L) 10/09/2023   PLT 16 (LL) 10/09/2023   Most recent BMP    Latest Ref Rng & Units 10/06/2023    7:30 AM  BMP  Glucose 70 - 99 mg/dL 409   BUN 6 - 20 mg/dL 6   Creatinine 8.11 - 9.14 mg/dL 7.82   Sodium 956 - 213 mmol/L 139   Potassium 3.5 - 5.1 mmol/L 4.1   Chloride 98 - 111 mmol/L 108   CO2 22 - 32 mmol/L 24   Calcium 8.9 - 10.3 mg/dL 8.6     Other pertinent labs:  Folate: 14.6 WNL Heptoglobin: Awaiting  Reticulocytes: 17.1 ? 47.0 Immature platelet fraction: 12.9 (1.2-8.6%)  Imaging/Diagnostic Tests:  No new imaging.  Tomie China, MD 10/09/2023, 7:52 AM  PGY-1, [facility] Family [facility] FPTS Intern pager: [phone], text pages welcome Secure chat group [facility] Teaching Service

## 2023-10-09 NOTE — Consult Note (Signed)
Alfred Phillips Psychiatry Consult Evaluation  Service Date: October 09, 2023 LOS:  LOS: 5 days   Primary Psychiatric Diagnoses  Bipolar disorder, current episode manic (r/o schizoaffective disorder bipolar type, r/o underlying medical condition contributing)  Assessment  Alfred Phillips is a 37 y.o. male admitted medically for 10/04/2023  2:51 PM for pancytopenia. He carries the psychiatric diagnoses of none and has a past medical history of sickle cell disease. Psychiatry was consulted for concern for psychosis by Tomie China, MD.  His initial presentation of intermittent grandiosity, delusions, paranoia, poor sleep, increased energy, increased impulsivity in the past 2 years and worsening over the past 6 months is most consistent with either psychosis due to an underlying medical condition (his anemia, ?autoimmune process) or a primary psychiatric diagnosis of bipolar disorder, current episode manic. Unclear if he is having psychosis even when he is not in a mood episode given that he is a poor historian and the fact that he is not staying with collateral all the time. No mental health history or psychiatric hospitalizations prior to this. On initial examination, patient appears calm but he has paranoia related to his mother, disorganized thinking related to his ability to work. He minimizes and/or denies all the symptoms leading up to his hospitalization but collateral reports that prior to admission patient was  having delusions, decreased sleep, rapid speech, grandiosity, decrease in goal-directed activities. Recommend starting haldol to treat his psychosis, adding cogentin for EPS prevention given risk factors (young AA male).    10/14- Patient thought content is more logical; he does endorse his own thoughts that he knows himself better than anyone else, and that his family just wanted him out of the house and nothing is wrong with him. Patient endorses what appear to be baseline negative views  about the practice of medicine, endorsing feeling that pills are being "pushed on him" for the hospital and doctors to make money and he does not like that he has been labeled Bipolar. Patient continues to be irritable about being hospitalized, but no behavior events have occurred in the last 24hrs. Regardless, patient was aware that he has been dx bipolar with concern for schizoaffective d/o and was also aware that he was medically hospitalized die to his abnormal CBC. Patient was willing to listen to provider discuss medical team concerns regarding his plt levels and his mentation on arrival.  Patient continues to have very poor insight and restricted thinking, but some of this appears to be patient possible baseline thought content, as he embraces a "holistic" approach to life., where he believes in playing music for free and only using "natural things like marijuana" for medicine. Patient continues to see no problem with what appear to be his manic behaviors. While patient does not like taking Haldol, he did not endorse he would refuse to take it in the hospital. Currently patient does not endorse continued paranoid symptoms and his sleep and thought process appear to be improving on current regimen. No rigidity was noted in extremities on assessment today decreasing concern for dystonic reaction. EKG is WNL. Will continue Haldol and cogentin this may help with continued improvement in patient thought content as he is preoccupied with his "holistic" views. Unsure if this aligns with natural variant societal views or delusion that has gone untreated for some time.    Please see plan below for detailed recommendations.   Diagnoses:  Active Hospital problems: Principal Problem:   Anemia of unknown etiology Active Problems:   Psychosis (HCC)  Pancytopenia (HCC)   Thrombocytopenia (HCC)     Plan   ## Psychiatric Medication Recommendations:  -- Continue haldol 5mg  BID for psychosis  -- Continue  cogentin 0.5mg  daily for EPS prevention -- Continue hydroxyzine 25mg  TID PRN for anxiety  -- Continue trazodone 50mg  PRN for sleep  -- Continue PRN agitation protocol: haldol, benadryl, ativan -- Continue IM benadryl 50mg  Q6H PRN for acute dystonic reaction   ## Medical Decision Making Capacity:  -- not formally assessed   ## Further Work-up:  -- per primary   -- most recent EKG on 10/14 had QtC of 445 with NSR 89bpm -- Pertinent labwork reviewed earlier this admission includes: ethanol 218, TSH wnl, UDS wnl, iron 13, %sat 3, ferritin 3, HIV NR, RPR NR, ESR/CRP wnl, CT head wnl, hepatitis panel wnl, CBC Hgb 4 -> 5.3. WBC 3. ANA negative.     Latest Ref Rng & Units 10/09/2023    6:28 AM 10/08/2023    6:16 PM 10/08/2023    1:06 PM  CBC  WBC 4.0 - 10.5 K/uL 6.4  3.6  5.0   Hemoglobin 13.0 - 17.0 g/dL 8.4  8.3  7.7   Hematocrit 39.0 - 52.0 % 29.1  29.1  26.4   Platelets 150 - 400 K/uL 16  16  15       ## Disposition:  -- Inpatient psych admission, once medically stable  ## Behavioral / Environmental:  --   No specific recommendations at this time.   #Legal --INVOL  ## Safety and Observation Level:  - Based on my clinical evaluation, I estimate the patient to be at high risk of self harm in the current setting - At this time, we recommend a 1:1 level of observation. This decision is based on my review of the chart including patient's history and current presentation, interview of the patient, mental status examination, and consideration of suicide risk including evaluating suicidal ideation, plan, intent, suicidal or self-harm behaviors, risk factors, and protective factors. This judgment is based on our ability to directly address suicide risk, implement suicide prevention strategies and develop a safety plan while the patient is in the clinical setting. Please contact our team if there is a concern that risk level has changed.  Suicide risk assessment  Patient has following  modifiable risk factors for suicide: psychosis which we are addressing by treating his psychosis.   Patient has following non-modifiable or demographic risk factors for suicide: male gender  Patient has the following protective factors against suicide: Supportive family, no history of suicide attempts, and no history of NSSIB  Thank you for this consult request. Recommendations have been communicated to the primary team.  We will continue to follow at this time.   PGY-4 Bobbye Morton, MD  Psychiatric and Social History   Relevant Aspects of Hospital Course:  Admitted on 10/04/2023 for pancytopenia.  -S/p 2u PRBC  Initially brought to [facility] under IVC: "THE RESPONDENT HAS BEEN UP ALL NIGHT, HE SAYS HE DOESN'T have time to eat or sleep.  The respondent believes he is a God and the devil.  Responded has threatened to kill his family by poisoning their food.  The respondent rambles when he speaks, making nonsensical statements.  The respondent is aggressive towards me and the rest of the family.  The respondent drinks and smokes marijuana daily. IVC petitioner is Burnis Medin patient's mother." At [facility], patient stated that he was brought for "playing music at 5am" Per the police,   Patient at  [facility] appeared to be labile (irritable, anxious, euthymic, guarded) and speech was clear with increased volume, periods of pauses consistent with thought blocking vs selectively mute.  In the ED, he endorsed that he had not been sleeping and he told the EDP that "I cannot die" and "I am of God." He was given IM geodon in the ED.   Patient Report 10/09/2023:  Patient is seen laying in his bed. Patient reports that his mom called the cops on him and he refuses to call her while hospitalized. Patient denies feeling that his mother is specifically out to get him, but reports that he feels she was not "mature enough" to tell him she did not want him in the home and called the cops. Patient endorses that he had  been "making music" and he was feeling "inspired" around the time the cops we called. Patient endorses today, that he only wants help with finding a new place to live, and he feels that since no one will likely be able to help with this he wants to leave. Patient endorses that he has thoughts that some of what is wrong with him is related to his living situation. Patient endorses beliefs that he is misunderstood and he wants to live more "holistically" and like Jesus. Patient does not endorse being Jesus, but reports that he is a [place] and he wants to do things out of kindness and feels that everyone around him does things for money. Patient reports that this is why he does not like healthcare and wants to be "natural." Patient endorses that he feels that doctors are trying to make money and push pills and diagnosis on him, because he felt fine before coming in. Provider and patient did discuss patient likely has had a slow decline with his pancytopenia, which made it more likely his body slowly adjusted. Patient did not negate this being possible. Patient endorses that he likes making music and plays "all instruments" but when asked his favorite he reports "guitar and drums" patient endorses that his last song he made was in Afro -beats style. Patient denies SI, HI, and AVH. Patient reports he has been sleeping as well as he can with night time checks and that his appetite is fair.   Psych ROS:  Depression: Patient denies any depressive symptoms prior to hospitalization.  Anxiety:  He also denies any anxiety symptoms. Mania: He denies any manic symptoms but reports that at times he can stay up all night writing 10 songs while also doing push-ups. Per chart review, had grandiosity, delusions of being God. Psychosis: He has some paranoia towards his mom. He denies having delusions, thought insertion, withdrawal, or broadcasting. He denies AVH.   Collateral information:  Contacted mom Burnis Medin at  [phone] on 10/06/23 Reports usually he likes to drink. He drinks alcohol for a long time. But then he was saying he was talking saying he was God that he can kill you. He sometimes tells her he is not his mom. He wants daddy to tell her he is not his mom. Reports he started to insult his uncle. Reports he has a history of trespassing until 6am. Reports he was playing his music until the night. He used to be able to cook, everything. He will talk and worship. He will sometimes say he is God and everything belongs to him, reports this behavior has been going on for 2 years, 6 months ago it got worse, violence and threats. Drinking, not sleeping. Rapid speech.  He's fighting with her and his siblings. No history of mental health. He didn't want to go to hospital. When he's not drinking, 3-4 bottles of wine or gin, unsure of frequency. He is intermittently at home. on the streets, and in jail. When he also drinks gets really violent. He can walk 30 miles to look for a drink. If he don't drink, he doesn't fight anybody. If he drinks, he will state that he can help you go to hell. He's also said that he can poison your food. Reports they called the police on him because at night he didn't sleep because he kept talking loudly, walking around, playing music, saying things that didn't make sense, how he was in jail and he goes intentionally, he's better off in jail. Stating if family is not careful, he will help [place] go to jail. He told mom that she can go to [place]. It's only dead people that are free. Night before that he wasn't at home, he was just released from jail. Will do same to people outside and then they will call him for harrassment. He will also get arrested for stealing because he thinks everything belongs to him because he is God. Don't know access to gun.   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 Lives with mother, is intermittently homeless. Moved to [place] from [place] Considers support system to be God. Denies having people that support him.   Access to weapons: denies   Substance History Tobacco use: yes, 1/2 ppd. Declines tobacco replacement therapy  Alcohol use: reports drinking sometimes, whenever I get it. No cravings, no withdrawals. Is not forthcoming with how much he is drinking when not in hospital  Cannabis: denies   Exam Findings   Psychiatric Specialty Exam:  Presentation  General Appearance: Appropriate for Environment  Eye Contact:Good  Speech:Clear and Coherent; Normal Rate  Speech Volume:Normal  Handedness:Right   Mood and Affect  Mood:Irritable  Affect:Congruent; Restricted   Thought Process  Thought Processes:Linear  Descriptions of Associations:Intact  Orientation:Full (Time, Place and Person)  Thought Content:Perseveration  Hallucinations:Hallucinations: None   Ideas of Reference:None  Suicidal Thoughts:Suicidal Thoughts: No   Homicidal Thoughts:Homicidal Thoughts: No    Sensorium  Memory:Immediate Fair; Recent Poor  Judgment:Poor  Insight:Poor   Executive Functions  Concentration:Good  Attention Span:Good  Recall:Fair  Fund of Knowledge:Poor  Language:Good   Psychomotor Activity  Psychomotor Activity:Psychomotor Activity: Normal    Assets   Assets:Resilience   Sleep  Sleep:Sleep: Good     Physical Exam: Vital signs:  Temp:  [98.2 F (36.8 C)-99.1 F (37.3 C)] 98.6 F (37 C) (10/14 0748) Pulse Rate:  [52-95] 87 (10/14 0748) Resp:  [16] 16 (10/14 0630) BP: (96-157)/(61-106) 96/61 (10/14 0748) SpO2:  [98 %-100 %] 100 % (10/14 0748) Physical Exam Constitutional:      Appearance: Normal appearance.  HENT:     Head:  Normocephalic and atraumatic.  Pulmonary:     Effort: Pulmonary effort is normal.  Skin:    General: Skin is warm and dry.  Neurological:     General: No focal deficit present.     Mental Status: He is alert.  Psychiatric:        Mood and Affect: Affect is labile.        Behavior: Behavior is cooperative.        Thought Content: Thought content is paranoid.     Comments: Speech can be normal rate but become more rapid  Cooperative but can get agitated easily     Blood pressure 96/61, pulse 87, temperature 98.6 F (37 C), temperature source Oral, resp. rate 16, height 5\' 8"  (1.727 m), weight 63.5 kg, SpO2 100%. Body mass index is 21.29 kg/m.   Other History   These have been pulled in through the EMR, reviewed, and updated if appropriate.   Family History:  None The patient's family history is not on file.  Medical History: Past medical history of sickle cell trait   Surgical History: None  Medications:   Current Facility-Administered Medications:    benztropine (COGENTIN) tablet 0.5 mg, 0.5 mg, Oral, Daily, Karie Fetch, MD, 0.5 mg at 10/09/23 0981   haloperidol (HALDOL) tablet 5 mg, 5 mg, Oral, TID PRN **AND** LORazepam (ATIVAN) tablet 2 mg, 2 mg, Oral, TID PRN **AND** diphenhydrAMINE (BENADRYL) capsule 50 mg, 50 mg, Oral, TID PRN, Massengill, Harrold Donath, MD   haloperidol lactate (HALDOL) injection 5 mg, 5 mg, Intramuscular, TID PRN **AND** LORazepam (ATIVAN) injection 2 mg, 2 mg, Intramuscular, TID PRN **AND** diphenhydrAMINE (BENADRYL) injection 50 mg, 50 mg, Intramuscular, TID  PRN, Massengill, Harrold Donath, MD   diphenhydrAMINE (BENADRYL) injection 50 mg, 50 mg, Intramuscular, Q6H PRN, Karie Fetch, MD   ferrous sulfate tablet 325 mg, 325 mg, Oral, QODAY, Dahbura, Anton, DO, 325 mg at 10/09/23 1914   folic acid (FOLVITE) tablet 2 mg, 2 mg, Oral, Daily, 2 mg at 10/09/23 0829 **OR** folic acid 1 mg in sodium chloride 0.9 % 50 mL IVPB, 1 mg, Intravenous, Daily, Dameron, Marisa, DO   haloperidol (HALDOL) tablet 5 mg, 5 mg, Oral, Q12H, Massengill, Nathan, MD, 5 mg at 10/09/23 7829   hydrOXYzine (ATARAX) tablet 25 mg, 25 mg, Oral, TID PRN, Massengill, Harrold Donath, MD   multivitamin with minerals tablet 1 tablet, 1 tablet, Oral, Daily, Shelby Mattocks, DO, 1 tablet at 10/09/23 0830   thiamine (VITAMIN B1) tablet 100 mg, 100 mg, Oral, Daily, 100 mg at 10/09/23 0829 **OR** thiamine (VITAMIN B1) injection 100 mg, 100 mg, Intravenous, Daily, Dahbura, Anton, DO   traZODone (DESYREL) tablet 50 mg, 50 mg, Oral, QHS PRN, Phineas Inches, MD  Allergies: No Known Allergies

## 2023-10-09 NOTE — Assessment & Plan Note (Addendum)
Patient continues to take his psychiatric medications.  Patient appears at psychiatric baseline.  Very poor insight into manic symptomatology. See psychiatry progress note for further detail. - Psychiatry recs as follows:  -- Continue haldol 5mg  BID for psychosis with concurrent Cogentin 0.5mg  for EPS control. -- Continue hydroxyzine 25mg  TID PRN for anxiety. -- Continue trazodone 50mg  PRN for sleep. -- Continue PRN agitation protocol: haldol, benadryl, ativan. - Patient has capacity to refuse treatment/workup, including IV platelets.  - Patient is under active IVC orders from 10/9 to 10/16.  Will need to be renewed by then, if necessary. - 1:1 continuous monitoring ordered

## 2023-10-09 NOTE — Progress Notes (Signed)
Hematology/Oncology Progress Note  Clinical Summary: [name] is a 37 year old male with medical history significant for mental illness who presents with severe iron deficiency anemia and thrombocytopenia.   Interval History: --Hgb today up to 8.4, Plt fell to 16. WBC 6.4 -- no overt signs of bleeding --modestly elevated immature platelet fraction, no reticulocytosis -- psychiatry services consulted.   O:  Vitals:   10/09/23 0630 10/09/23 0748  BP: 106/72 96/61  Pulse: (!) 52 87  Resp: 16   Temp: 98.2 F (36.8 C) 98.6 F (37 C)  SpO2: 99% 100%      Latest Ref Rng & Units 10/06/2023    7:30 AM 10/05/2023    2:33 AM 10/04/2023    6:44 PM  CMP  Glucose 70 - 99 mg/dL 409  76  86   BUN 6 - 20 mg/dL 6  6  6    Creatinine 0.61 - 1.24 mg/dL 8.11  9.14  7.82   Sodium 135 - 145 mmol/L 139  141  146   Potassium 3.5 - 5.1 mmol/L 4.1  3.9  3.8   Chloride 98 - 111 mmol/L 108  108  111   CO2 22 - 32 mmol/L 24  23  22    Calcium 8.9 - 10.3 mg/dL 8.6  8.4  8.3   Total Protein 6.5 - 8.1 g/dL 6.1  5.6  5.8   Total Bilirubin 0.3 - 1.2 mg/dL 1.0  0.7  0.3   Alkaline Phos 38 - 126 U/L 42  35  37   AST 15 - 41 U/L 21  18  16    ALT 0 - 44 U/L 12  10  11        Latest Ref Rng & Units 10/09/2023    6:28 AM 10/08/2023    6:16 PM 10/08/2023    1:06 PM  CBC  WBC 4.0 - 10.5 K/uL 6.4  3.6  5.0   Hemoglobin 13.0 - 17.0 g/dL 8.4  8.3  7.7   Hematocrit 39.0 - 52.0 % 29.1  29.1  26.4   Platelets 150 - 400 K/uL 16  16  15        Assessment/Plan:  # Severe Iron Deficiency Anemia-improved # Thrombocytopenia-worsening # Neutropenia-fluctuant. -- Initial labs are concerning for severe iron deficiency anemia with iron sat of 3% with ferritin of 3 -- Recommend transfusion if patient's hemoglobin <7.0 or Plt <20 -- No overt signs of bleeding, would recommend consultation with gastroenterology once patient is stable from a hematological and psychological standpoint. -- No evidence of hemolysis  with normal LDH.  Reticulocytes are suppressed, should be improving with iron infusion but are not increasing at rate we would expect.  -- Severe iron deficiency can cause thrombocytopenia and neutropenia, though his counts are no improving as we would expect.  -- Low suspicion that his findings are related to his sickle cell trait, no clear evidence of a sickle cell crisis. -- CT scan of the head shows no evidence of intracranial bleed, stroke, or other clear etiology for his altered mental status --recommend pursuing a bone marrow biopsy as he has pancytopenia with poor marrow output.  -- Hematology service will continue to follow.   Ulysees Barns, MD Department of Hematology/Oncology [facility] Phone: [phone] Pager: [phone] Email: [email or web address]

## 2023-10-09 NOTE — Assessment & Plan Note (Signed)
Does not appear to exhibit any sign of alcohol withdrawal.  CIWAs have been zero throughout admission. -- D/C CIWA - Folate (as above) and thiamine supplementation.

## 2023-10-09 NOTE — Assessment & Plan Note (Addendum)
Thrombocytopenic to 20 ? 18 ? 12 ? 16.  S/p 3 units of platelets.  Immature platelet fraction 12.9%, high.  Indicates increased platelet production.  Per hematology, going forward threshold for platelet transfusion is <10. - Appreciate further hematology recommendations. - Awaiting haptoglobin/reticulocytes/immature platelet fraction - Ordered splenic ultrasound to rule out sequestration picture ISO unmoving platelet levels after 3 units given - Follow-up a.m. CBC/BMP 10/15 - Platelet transfusion threshold <20 ? <10 - Likely will need bone marrow biopsy prior to considering steroids or IVIG

## 2023-10-09 NOTE — Progress Notes (Signed)
Evaluated patient at bedside after being contacted by nursing staff as patient was complaining of 10 out of 10 low back pain and refusing his platelet transfusion.  Patient reported that his pain was throbbing in nature and located around his sacroiliac region.  It did improve with some positional changes especially if he was standing lying on his stomach.  He declined physical exam.  We discussed with him again the necessity of having the platelet transfusions.  He asked how many he would need, and we informed him that we were unsure it depended on how his body reacted, but that we only plan to transfuse 1 unit tonight.  Patient agreed to the transfusion after discussion.  Orders were placed for high-dose Tylenol, topical Voltaren gel and K-pad's to help with the patient's back pain.  Gerrit Heck, DO PGY-1, Family Medicine

## 2023-10-10 ENCOUNTER — Inpatient Hospital Stay (HOSPITAL_COMMUNITY): Payer: 59

## 2023-10-10 DIAGNOSIS — M549 Dorsalgia, unspecified: Secondary | ICD-10-CM | POA: Insufficient documentation

## 2023-10-10 DIAGNOSIS — D61818 Other pancytopenia: Secondary | ICD-10-CM | POA: Diagnosis not present

## 2023-10-10 DIAGNOSIS — D696 Thrombocytopenia, unspecified: Secondary | ICD-10-CM | POA: Diagnosis not present

## 2023-10-10 DIAGNOSIS — D649 Anemia, unspecified: Secondary | ICD-10-CM | POA: Diagnosis not present

## 2023-10-10 LAB — CBC
HCT: 28.5 % — ABNORMAL LOW (ref 39.0–52.0)
Hemoglobin: 8.4 g/dL — ABNORMAL LOW (ref 13.0–17.0)
MCH: 23.3 pg — ABNORMAL LOW (ref 26.0–34.0)
MCHC: 29.5 g/dL — ABNORMAL LOW (ref 30.0–36.0)
MCV: 78.9 fL — ABNORMAL LOW (ref 80.0–100.0)
Platelets: 30 10*3/uL — ABNORMAL LOW (ref 150–400)
RBC: 3.61 MIL/uL — ABNORMAL LOW (ref 4.22–5.81)
RDW: 29.1 % — ABNORMAL HIGH (ref 11.5–15.5)
WBC: 10.2 10*3/uL (ref 4.0–10.5)
nRBC: 3.9 % — ABNORMAL HIGH (ref 0.0–0.2)

## 2023-10-10 LAB — PREPARE PLATELET PHERESIS: Unit division: 0

## 2023-10-10 LAB — URINALYSIS, COMPLETE (UACMP) WITH MICROSCOPIC
Bilirubin Urine: NEGATIVE
Glucose, UA: NEGATIVE mg/dL
Hgb urine dipstick: NEGATIVE
Ketones, ur: NEGATIVE mg/dL
Leukocytes,Ua: NEGATIVE
Nitrite: NEGATIVE
Protein, ur: NEGATIVE mg/dL
Specific Gravity, Urine: 1.01 (ref 1.005–1.030)
Squamous Epithelial / HPF: NONE SEEN /[HPF] (ref 0–5)
WBC, UA: NONE SEEN WBC/hpf (ref 0–5)
pH: 7 (ref 5.0–8.0)

## 2023-10-10 LAB — HAPTOGLOBIN: Haptoglobin: 107 mg/dL (ref 17–317)

## 2023-10-10 LAB — BASIC METABOLIC PANEL
Anion gap: 9 (ref 5–15)
BUN: 7 mg/dL (ref 6–20)
CO2: 25 mmol/L (ref 22–32)
Calcium: 9.3 mg/dL (ref 8.9–10.3)
Chloride: 104 mmol/L (ref 98–111)
Creatinine, Ser: 0.9 mg/dL (ref 0.61–1.24)
GFR, Estimated: >60 mL/min (ref >60)
Glucose, Bld: 86 mg/dL (ref 70–99)
Potassium: 4.1 mmol/L (ref 3.5–5.1)
Sodium: 138 mmol/L (ref 135–145)

## 2023-10-10 LAB — BPAM PLATELET PHERESIS
Blood Product Expiration Date: 202410162359
ISSUE DATE / TIME: 202410142006
Unit Type and Rh: 5100

## 2023-10-10 LAB — TRANSFUSION REACTION
DAT C3: NEGATIVE
Post RXN DAT IgG: NEGATIVE

## 2023-10-10 LAB — OCCULT BLOOD X 1 CARD TO LAB, STOOL: Fecal Occult Bld: NEGATIVE

## 2023-10-10 MED ORDER — IOHEXOL 350 MG/ML SOLN
75.0000 mL | Freq: Once | INTRAVENOUS | Status: AC | PRN
  Administered 2023-10-10: 75 mL via INTRAVENOUS

## 2023-10-10 NOTE — Assessment & Plan Note (Addendum)
Hemoglobin 8.4 ? 8.4, stable.  WBC to 6.4.  Platelets finally above the infusion threshold s/p 3 units.  Likeliest etiology is iron deficiency anemia secondary to occult bleed.  Does not explain poor marrow output.  Reticulocyte/platelet/WBC levels improving with iron infusion, but not at rate expected. - Replete if hemoglobin <7.0 as above.

## 2023-10-10 NOTE — Progress Notes (Signed)
PA came to talk to pt about the bone marrow biopsy for today and patient declined.  Procedure cancelled and reg diet placed.

## 2023-10-10 NOTE — Assessment & Plan Note (Addendum)
Leading etiology is severe iron deficiency per heme/onc. S/p feraheme infusion 10/10. Unlikely related to previous diagnosis of sickle cell trait. No obvious signs of active bleeding. Hemoglobin stable at 8.4 this AM.  Amenable to stool sample today.  As patient has already declined colonoscopy, the last immediate option to workup GI bleed/malignancy is imaging. - CT scan of abdomen/pelvis/chest  - Reached out to nursing for FOBT collection - Continue folate supplementation to 2mg  and checking folic acid level - Oral Fe supplementation, deferring further IV iron at this time in setting of 4 units red blood cells, previous infusion 10/10 - Continue to trend CBCs qDay and monitor for frank bleeding - Transfuse pRBCs @ <7.0 - Consider GI consult after patient stabilized to evaluate for GI bleed vs malignancy

## 2023-10-10 NOTE — Plan of Care (Signed)

## 2023-10-10 NOTE — Assessment & Plan Note (Addendum)
Thrombocytopenic to 16 ? 16 ? 30.  Was receiving another platelet transfusion overnight when patient body temperature rose to 100.2, stopped.  CBC later in morning showed platelets above 30, above transfusions threshold. Patient refused bone marrow biopsy as recommended by heme. US spleen returned "slightly larger" when compared to previous CT scan.  - Hematology recs 10/14: Recommend transfusion if hemoglobin <7.0 or platelet <20 No overt signs of bleeding or hemolysis Likely unrelated to sickle cell trait Recommend bone marrow biopsy and GI workup - Follow-up a.m. CBC/BMP 10/16

## 2023-10-10 NOTE — Assessment & Plan Note (Addendum)
Patient continues to take his medications.  No concern for EPS at this time.  +/- some delusional symptomatology.  Very unlikely to re-up IVC tomorrow, as patient no longer exhibits frank psychotic symptomatology. - Psychiatry recs as follows:  -- Continue haldol 5mg  BID for psychosis with concurrent Cogentin 0.5mg  for EPS control. -- Continue hydroxyzine 25mg  TID PRN for anxiety. -- Continue trazodone 50mg  PRN for sleep. -- Continue PRN agitation protocol: haldol, benadryl, ativan. - Patient has capacity to refuse IV platelets.  Likely also has capacity to refuse medical workup/treatment, although this was not specifically evaluated by psychiatry. - Patient is under active IVC orders from 10/9 to 10/16.  Will need to be renewed by then, if necessary. - 1:1 continuous monitoring ordered

## 2023-10-10 NOTE — Assessment & Plan Note (Addendum)
Patient noted 10 out of 10 lower back pain and of 10/15.  Resolved by AM interview. - Continue Tylenol as needed - Continue to monitor

## 2023-10-10 NOTE — Progress Notes (Signed)
Daily Progress Note Intern Pager: [phone]  Patient name: Alfred Phillips Medical record number: [id number] Date of birth: 10/15/1986 Age: 37 y.o. Gender: male  Primary Care Provider: Pcp, No Consultants: Hematology, psychiatry Code Status: Full  Pt Overview and Major Events to Date:   10/9: admitted, 2u pRBCs 10/10: 2u pRBCs 10/12: 1u platelets 10/13: 2u platelets   Assessment and Plan: Alfred Phillips is a 37 year old male admitted for acute psychosis (under IVC until 10/16) and pancytopenia possibly secondary to severe iron deficiency anemia.    Negative labs for various etiologies of pancytopenia, including Hepatitis/HIV/RPR, sed rate/CRP, B12, and ANA. Hemoglobin improved and stable >7 after 4 units of blood and IV iron infusion with no obvious signs of active bleeding. Platelets steadily decreasing, receiving second platelet transfusion today. Pending bone marrow biopsy to investigate possibly heme malignancy. Leukocytes improved to normal.    Psychiatry following and treating psychosis with haldol and cogentin. Deemed to have capacity to refuse medical treatment workup. Presentation is most consistent with bipolar disorder versus schizoaffective disorder bipolar type.  For further detail, see collateral call with mother and psychiatry progress note 10/11. Pt has not required PRN psychiatry medications during admission.  We agree that patient likely has capacity to make decisions concerning his medical issues and does not want bone marrow biopsy, limiting options for workup.  IVC until 10/16.   PMH: sickle cell trait Assessment & Plan Thrombocytopenia (HCC) Thrombocytopenic to 16 ? 16 ? 30.  Was receiving another platelet transfusion overnight when patient body temperature rose to 100.2, stopped.  CBC later in morning showed platelets above 30, above transfusions threshold. Patient refused bone marrow biopsy as recommended by heme. US spleen returned "slightly larger" when compared to  previous CT scan.  - Hematology recs 10/14: Recommend transfusion if hemoglobin <7.0 or platelet <20 No overt signs of bleeding or hemolysis Likely unrelated to sickle cell trait Recommend bone marrow biopsy and GI workup - Follow-up a.m. CBC/BMP 10/16 Psychosis (HCC) Patient continues to take his medications.  No concern for EPS at this time.  +/- some delusional symptomatology.  Very unlikely to re-up IVC tomorrow, as patient no longer exhibits frank psychotic symptomatology. - Psychiatry recs as follows:  -- Continue haldol 5mg  BID for psychosis with concurrent Cogentin 0.5mg  for EPS control. -- Continue hydroxyzine 25mg  TID PRN for anxiety. -- Continue trazodone 50mg  PRN for sleep. -- Continue PRN agitation protocol: haldol, benadryl, ativan. - Patient has capacity to refuse IV platelets.  Likely also has capacity to refuse medical workup/treatment, although this was not specifically evaluated by psychiatry. - Patient is under active IVC orders from 10/9 to 10/16.  Will need to be renewed by then, if necessary. - 1:1 continuous monitoring ordered Anemia of unknown etiology Leading etiology is severe iron deficiency per heme/onc. S/p feraheme infusion 10/10. Unlikely related to previous diagnosis of sickle cell trait. No obvious signs of active bleeding. Hemoglobin stable at 8.4 this AM.  Amenable to stool sample today.  As patient has already declined colonoscopy, the last immediate option to workup GI bleed/malignancy is imaging. - CT scan of abdomen/pelvis/chest  - Reached out to nursing for FOBT collection - Continue folate supplementation to 2mg  and checking folic acid level - Oral Fe supplementation, deferring further IV iron at this time in setting of 4 units red blood cells, previous infusion 10/10 - Continue to trend CBCs qDay and monitor for frank bleeding - Transfuse pRBCs @ <7.0 - Consider GI consult after patient stabilized to  evaluate for GI bleed vs malignancy Pancytopenia  (HCC) Hemoglobin 8.4 ? 8.4, stable.  WBC to 6.4.  Platelets finally above the infusion threshold s/p 3 units.  Likeliest etiology is iron deficiency anemia secondary to occult bleed.  Does not explain poor marrow output.  Reticulocyte/platelet/WBC levels improving with iron infusion, but not at rate expected. - Replete if hemoglobin <7.0 as above. Back pain Patient noted 10 out of 10 lower back pain and of 10/15.  Resolved by AM interview. - Continue Tylenol as needed - Continue to monitor  Chronic and Stable Issues:  Alcohol: EtOH 281 on admit.  No signs of withdrawal.  DC'd CIWAs after 72 hours.  Vitamin supplementation as above.  FEN/GI: Regular PPx: SCDs Dispo: Pending clinical improvement  Subjective:   On interview, patient sitting up in bed with sitter in room.  No acute complaints.  No new concerns.  Amenable to stool sample.  Continues to decline bone marrow biopsy at this time.  Objective:  BP: 120/82 HR: 78 RR: 17 T: 98.9 O2sat: 99% on room air  Significant vitals over past 24 hours:   Physical Exam:  General: Well-appearing male sitting up in bed, NAD, attentive. Cardiovascular: RRR no m/r/g. Respiratory: CTAB. No w/r/r.  Abdomen: No tenderness in 4 quadrants. BS+.  Extremities: ROM intact.  Nonedematous.  Basic labs:  Most recent CBC Lab Results  Component Value Date   WBC 10.2 10/10/2023   HGB 8.4 (L) 10/10/2023   HCT 28.5 (L) 10/10/2023   MCV 78.9 (L) 10/10/2023   PLT 30 (L) 10/10/2023   Most recent BMP    Latest Ref Rng & Units 10/10/2023    4:59 AM  BMP  Glucose 70 - 99 mg/dL 86   BUN 6 - 20 mg/dL 7   Creatinine 1.61 - 0.96 mg/dL 0.45   Sodium 409 - 811 mmol/L 138   Potassium 3.5 - 5.1 mmol/L 4.1   Chloride 98 - 111 mmol/L 104   CO2 22 - 32 mmol/L 25   Calcium 8.9 - 10.3 mg/dL 9.3     Other pertinent labs:  UA: Unremarkable  Imaging/Diagnostic Tests:  US Spleen  IMPRESSION: Normal caliber spleen today by ultrasound. Overall  slightly larger however compared to the prior CT scan  Tomie China, MD 10/10/2023, 7:28 AM  PGY-1, [facility] FPTS Intern pager: [phone], text pages welcome Secure chat group [facility] Teaching Service

## 2023-10-10 NOTE — Consult Note (Addendum)
Alfred Phillips Psychiatry Consult Evaluation  Service Date: October 10, 2023 LOS:  LOS: 6 days   Primary Psychiatric Diagnoses  Bipolar disorder, current episode manic (r/o schizoaffective disorder bipolar type, r/o underlying medical condition contributing)  Assessment  Alfred Phillips is a 37 y.o. male admitted medically for 10/04/2023  2:51 PM for pancytopenia. He carries the psychiatric diagnoses of none and has a past medical history of sickle cell disease. Psychiatry was consulted for concern for psychosis by Alfred China, MD.  His initial presentation of intermittent grandiosity, delusions, paranoia, poor sleep, increased energy, increased impulsivity in the past 2 years and worsening over the past 6 months is most consistent with either psychosis due to an underlying medical condition (his anemia, ?autoimmune process) or a primary psychiatric diagnosis of bipolar disorder, current episode manic. Unclear if he is having psychosis even when he is not in a mood episode given that he is a poor historian and the fact that he is not staying with collateral all the time. No mental health history or psychiatric hospitalizations prior to this. On initial examination, patient appears calm but he has paranoia related to his mother, disorganized thinking related to his ability to work. He minimizes and/or denies all the symptoms leading up to his hospitalization but collateral reports that prior to admission patient was  having delusions, decreased sleep, rapid speech, grandiosity, decrease in goal-directed activities. Recommend starting haldol to treat his psychosis, adding cogentin for EPS prevention given risk factors (young AA male).   10/14- Patient thought content is more logical; he does endorse his own thoughts that he knows himself better than anyone else, and that his family just wanted him out of the house and nothing is wrong with him. Patient endorses what appear to be baseline negative views about  the practice of medicine, endorsing feeling that pills are being "pushed on him" for the hospital and doctors to make money and he does not like that he has been labeled Bipolar. Patient continues to be irritable about being hospitalized, but no behavior events have occurred in the last 24hrs. Regardless, patient was aware that he has been dx bipolar with concern for schizoaffective d/o and was also aware that he was medically hospitalized die to his abnormal CBC. Patient was willing to listen to provider discuss medical team concerns regarding his plt levels and his mentation on arrival.  Patient continues to have very poor insight and restricted thinking, but some of this appears to be patient possible baseline thought content, as he embraces a "holistic" approach to life., where he believes in playing music for free and only using "natural things like marijuana" for medicine. Patient continues to see no problem with what appear to be his manic behaviors. While patient does not like taking Haldol, he did not endorse he would refuse to take it in the hospital. Currently patient does not endorse continued paranoid symptoms and his sleep and thought process appear to be improving on current regimen. No rigidity was noted in extremities on assessment today decreasing concern for dystonic reaction. EKG is WNL. Will continue Haldol and cogentin this may help with continued improvement in patient thought content as he is preoccupied with his "holistic" views. Unsure if this aligns with natural variant societal views or delusion that has gone untreated for some time.   10/15 - Patient continues to have more logical thought content. He has had 4 days of psychotropic medication (haldol) without EPS or medication side effect. Of note, his pancytopenia precedes initial  administration of antipsychotic. He does not appear to be responding to internal or external stimuli and has not required any PRNs during this  hospitalization or episodes where he had increased grandiosity. He seems to have some baseline mistrust and delusions regarding the medical system and wants to pursue a more natural and "holistic" approach to life. He continues to have poor insight into his psychiatric illness. He is able to repeat the reasoning behind the bone marrow biopsy (finding out why my bone is not making blood), reports a consistent decision to refuse the bone marrow biopsy (not interested in finding out why my bone is not making blood), understands that the majority of people would want to find out this information and understand that not finding out the reason behind this and pursuing further treatment could lead to death. Continues to decline contacting his family. Would continue IVC until he is stabilized from a medical standpoint but patient does not appear to need acute crisis stabilization from an inpatient psychiatric unit at this time.   Please see plan below for detailed recommendations.   Diagnoses:  Active Hospital problems: Principal Problem:   Anemia of unknown etiology Active Problems:   Psychosis (HCC)   Pancytopenia (HCC)   Thrombocytopenia (HCC)   Iron deficiency anemia due to chronic blood loss   Back pain     Plan   ## Psychiatric Medication Recommendations:  -- Continue haldol 5mg  BID for psychosis  -- Continue cogentin 0.5mg  daily for EPS prevention -- Continue hydroxyzine 25mg  TID PRN for anxiety  -- Continue trazodone 50mg  PRN for sleep  -- Continue PRN agitation protocol: haldol, benadryl, ativan -- Continue IM benadryl 50mg  Q6H PRN for acute dystonic reaction   ## Medical Decision Making Capacity:  -- not formally assessed   ## Further Work-up:  -- per primary   -- most recent EKG on 10/14 had QtC of 445 with NSR 89bpm  -- Pertinent labwork reviewed earlier this admission includes: ethanol 218, TSH wnl, UDS wnl, iron 13, %sat 3, ferritin 3, HIV NR, RPR NR, ESR/CRP wnl, CT head  wnl, hepatitis panel wnl, CBC Hgb 4 -> 5.3. WBC 3. ANA negative.     Latest Ref Rng & Units 10/10/2023    4:59 AM 10/09/2023    6:28 AM 10/08/2023    6:16 PM  CBC  WBC 4.0 - 10.5 K/uL 10.2  6.4  3.6   Hemoglobin 13.0 - 17.0 g/dL 8.4  8.4  8.3   Hematocrit 39.0 - 52.0 % 28.5  29.1  29.1   Platelets 150 - 400 K/uL 30  16  16       ## Disposition:  -- Home once medically stable  ## Behavioral / Environmental:  --   No specific recommendations at this time.   #Legal --INVOL (for medical treatment)  ## Safety and Observation Level:  - Based on my clinical evaluation, I estimate the patient to be at moderate risk of self harm in the current setting - At this time, we recommend a 1:1 level of observation. This decision is based on my review of the chart including patient's history and current presentation, interview of the patient, mental status examination, and consideration of suicide risk including evaluating suicidal ideation, plan, intent, suicidal or self-harm behaviors, risk factors, and protective factors. This judgment is based on our ability to directly address suicide risk, implement suicide prevention strategies and develop a safety plan while the patient is in the clinical setting. Please contact our team if there  is a concern that risk level has changed.  Suicide risk assessment  Patient has following modifiable risk factors for suicide: psychosis which we are addressing by treating his psychosis.   Patient has following non-modifiable or demographic risk factors for suicide: male gender  Patient has the following protective factors against suicide: Supportive family, no history of suicide attempts, and no history of NSSIB  Thank you for this consult request. Recommendations have been communicated to the primary team.  We will continue to follow at this time.   PGY-2 Karie Fetch, MD  Psychiatric and Social History   Relevant Aspects of Hospital Course:  Admitted on  10/04/2023 for pancytopenia.  -10/14 Refused bone marrow biopsy, started 1u plts, but was stopped 2/2 elevated temp  Initially brought to [facility] under IVC: "THE RESPONDENT HAS BEEN UP ALL NIGHT, HE SAYS HE DOESN'T have time to eat or sleep.  The respondent believes he is a God and the devil.  Responded has threatened to kill his family by poisoning their food.  The respondent rambles when he speaks, making nonsensical statements.  The respondent is aggressive towards me and the rest of the family.  The respondent drinks and smokes marijuana daily. IVC petitioner is Burnis Medin patient's mother." At [facility], patient stated that he was brought for "playing music at 5am" Per the police,   Patient at [facility] appeared to be labile (irritable, anxious, euthymic, guarded) and speech was clear with increased volume, periods of pauses consistent with thought blocking vs selectively mute.  In the ED, he endorsed that he had not been sleeping and he told the EDP that "I cannot die" and "I am of God." He was given IM geodon in the ED.   VSS. PRN tylenol. Plts 30.   Patient Report 10/10/2023:  Patient is seen eating breakfast at the bedside. He does not appear to be responding to internal or external stimuli. He reports that he is sleeping and eating alright. He reports his mood is "straight" which he defines as steady. When I asked patient if he's had the chance to talk with his family, he reports that he has not had a chance to talk with them. He reports that they "don't have that type of relationship." He reports it's more like "I see you when I see you" and he reports that they are "great actors." I encouraged patient to talk with his family. He denies SI/HI/AVH. He reports feeling at peace with himself. He denies any current side effects from the psychotropic medication but he is worried about long-term side effects from antipsychotics including sialorrhea, tardive dyskinesias. He continues to have limited insight  into his psychiatric illness and reports that he will not continue taking psychotropic medications when he leaves the hospital. He reports feeling like people with mental illness need more social support rather than the pharmaceutical companies pushing medications onto people. With regards to the bone marrow biopsy, he reports that he does not like people putting stuff into his body and he is "not interested in finding out why my bone is not making blood." He understands that the majority of people would consent to the bone marrow biopsy and he understands the risk of death without further investigation and possibly treatment into the reason for his pancytopenia.    Collateral information:  Contacted mom Burnis Medin at [phone] on 10/06/23 Reports usually he likes to drink. He drinks alcohol for a long time. But then he was saying he was talking saying he was God  that he can kill you. He sometimes tells her he is not his mom. He wants daddy to tell her he is not his mom. Reports he started to insult his uncle. Reports he has a history of trespassing until 6am. Reports he was playing his music until the night. He used to be able to cook, everything. He will talk and worship. He will sometimes say he is God and everything belongs to him, reports this behavior has been going on for 2 years, 6 months ago it got worse, violence and threats. Drinking, not sleeping. Rapid speech. He's fighting with her and his siblings. No history of mental health. He didn't want to go to hospital. When he's not drinking, 3-4 bottles of wine or gin, unsure of frequency. He is intermittently at home. on the streets, and in jail. When he also drinks gets really violent. He can walk 30 miles to look for a drink. If he don't drink, he doesn't fight anybody. If he drinks, he will state that he can help you go to hell. He's also said that he can poison your food. Reports they called the police on him because at night he didn't sleep  because he kept talking loudly, walking around, playing music, saying things that didn't make sense, how he was in jail and he goes intentionally, he's better off in jail. Stating if family is not careful, he will help [place] go to jail. He told mom that she can go to [place]. It's only dead people that are free. Night before that he wasn't at home, he was just released from jail. Will do same to people outside and then they will call him for harrassment. He will also get arrested for stealing because he thinks everything belongs to him because he is God. Don't know access to gun.   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 Lives with mother, is intermittently homeless. Moved to [place] from [place] Considers support system to be God. Denies having people that support him.   Access to weapons: denies   Substance History Tobacco use: yes, 1/2 ppd. Declines tobacco replacement therapy  Alcohol use: reports drinking sometimes, whenever I get it. No cravings, no withdrawals. Is not forthcoming with how much he is drinking when not in hospital  Cannabis: denies   Exam Findings   Psychiatric Specialty Exam:  Presentation  General Appearance: Appropriate for Environment  Eye Contact:Good  Speech:Clear and Coherent  Speech  Volume:Normal  Handedness:Right   Mood and Affect  Mood:Irritable  Affect:Restricted; Congruent   Thought Process  Thought Processes:Linear  Descriptions of Associations:Intact  Orientation:Full (Time, Place and Person)  Thought Content:Logical  Hallucinations:Hallucinations: None   Ideas of Reference:None  Suicidal Thoughts:Suicidal Thoughts: No   Homicidal Thoughts:Homicidal Thoughts: No    Sensorium  Memory:Immediate Fair; Recent Fair  Judgment:Intact  Insight:Poor (Poor insight into his psychiatric illness)   Art therapist  Concentration:Fair  Attention Span:Fair  Recall:Fair  Fund of Knowledge:Fair  Language:Fair   Psychomotor Activity  Psychomotor Activity:Psychomotor Activity: Normal    Assets  Assets:Resilience   Sleep  Sleep:Sleep: Good     Physical Exam: Vital signs:  Temp:  [98.6 F (37 C)-100.2 F (37.9 C)] 98.9 F (37.2 C) (10/15 0725) Pulse Rate:  [64-91] 78 (10/15 0725) Resp:  [17-18] 17 (10/15 0725) BP: (105-134)/(66-102) 112/82 (10/15 0725) SpO2:  [99 %-100 %] 99 % (10/15 0725) Physical Exam Constitutional:      Appearance: Normal appearance.  HENT:     Head: Normocephalic and atraumatic.  Pulmonary:     Effort: Pulmonary effort is normal.  Skin:    General: Skin is warm and dry.  Neurological:     General: No focal deficit present.     Mental Status: He is alert.  Psychiatric:        Behavior: Behavior is cooperative.     Blood pressure 112/82, pulse 78, temperature 98.9 F (37.2 C), temperature source Oral, resp. rate 17, height 5\' 8"  (1.727 m), weight 63.5 kg, SpO2 99%. Body mass index is 21.29 kg/m.   Other History   These have been pulled in through the EMR, reviewed, and updated if appropriate.   Family History:  None The patient's family history is not on file.  Medical History: Past medical history of sickle cell trait   Surgical History: None  Medications:   Current  Facility-Administered Medications:    acetaminophen (TYLENOL) tablet 1,000 mg, 1,000 mg, Oral, Q6H PRN, Gerrit Heck, DO, 1,000 mg at 10/09/23 2012   benztropine (COGENTIN) tablet 0.5 mg, 0.5 mg, Oral, Daily, Karie Fetch, MD, 0.5 mg at 10/10/23 1010   diclofenac Sodium (VOLTAREN) 1 % topical gel 2 g, 2 g, Topical, QID PRN, Gerrit Heck, DO   haloperidol (HALDOL) tablet 5 mg, 5 mg, Oral, TID PRN **AND** LORazepam (ATIVAN) tablet 2 mg, 2 mg, Oral, TID PRN **AND** diphenhydrAMINE (BENADRYL) capsule 50 mg, 50 mg, Oral, TID PRN, Massengill, Harrold Donath, MD   haloperidol lactate (HALDOL) injection 5 mg, 5 mg, Intramuscular, TID PRN **AND** LORazepam (ATIVAN) injection 2 mg, 2 mg, Intramuscular, TID PRN **AND** diphenhydrAMINE (BENADRYL) injection 50 mg, 50 mg, Intramuscular, TID PRN, Massengill, Harrold Donath, MD   diphenhydrAMINE (BENADRYL) injection 50 mg, 50 mg, Intramuscular, Q6H PRN, Karie Fetch, MD   ferrous sulfate tablet 325 mg, 325 mg, Oral, QODAY, Dahbura, Anton, DO, 325 mg at 10/09/23 4098   folic acid (FOLVITE) tablet 2 mg, 2 mg, Oral, Daily, 2 mg at 10/10/23 1001 **OR** folic acid 1 mg in sodium chloride 0.9 % 50 mL IVPB, 1 mg, Intravenous, Daily, Dameron, Marisa, DO   haloperidol (HALDOL) tablet 5 mg, 5 mg, Oral, Q12H, Massengill, Nathan, MD, 5 mg at 10/10/23 1001   hydrOXYzine (ATARAX) tablet 25 mg, 25 mg, Oral, TID PRN, Phineas Inches, MD   multivitamin with minerals tablet 1 tablet, 1 tablet, Oral, Daily, Shelby Mattocks, DO, 1 tablet at 10/10/23 1001   thiamine (VITAMIN B1) tablet 100 mg, 100 mg, Oral, Daily, 100 mg at 10/10/23 1001 **OR** thiamine (VITAMIN B1) injection 100 mg, 100 mg, Intravenous, Daily, Dahbura, Anton, DO   traZODone (DESYREL) tablet 50 mg, 50 mg, Oral, QHS PRN, Phineas Inches, MD  Allergies: No Known Allergies

## 2023-10-10 NOTE — Progress Notes (Signed)
Platelet transfusion stopped 15 minutes into transfusion d/t increase in temp.  MD aware.  Blood bank aware.  Transfusion reaction form filled out and platelets/tubing all sent back to blood bank.  Pt had no other symptoms.  No further orders.

## 2023-10-10 NOTE — Progress Notes (Signed)
   Request for Bone marrow biopsy received Pancytopenia  Pt refusing procedure  I did try to discuss with him how procedure was done---- says he "declines"

## 2023-10-11 ENCOUNTER — Other Ambulatory Visit (HOSPITAL_COMMUNITY): Payer: Self-pay

## 2023-10-11 DIAGNOSIS — D649 Anemia, unspecified: Secondary | ICD-10-CM | POA: Diagnosis not present

## 2023-10-11 LAB — CBC WITH DIFFERENTIAL/PLATELET
Abs Immature Granulocytes: 0.41 10*3/uL — ABNORMAL HIGH (ref 0.00–0.07)
Basophils Absolute: 0.2 10*3/uL — ABNORMAL HIGH (ref 0.0–0.1)
Basophils Relative: 3 %
Eosinophils Absolute: 0.3 10*3/uL (ref 0.0–0.5)
Eosinophils Relative: 4 %
HCT: 29.4 % — ABNORMAL LOW (ref 39.0–52.0)
Hemoglobin: 8.5 g/dL — ABNORMAL LOW (ref 13.0–17.0)
Immature Granulocytes: 5 %
Lymphocytes Relative: 15 %
Lymphs Abs: 1.3 10*3/uL (ref 0.7–4.0)
MCH: 22.7 pg — ABNORMAL LOW (ref 26.0–34.0)
MCHC: 28.9 g/dL — ABNORMAL LOW (ref 30.0–36.0)
MCV: 78.6 fL — ABNORMAL LOW (ref 80.0–100.0)
Monocytes Absolute: 1.6 10*3/uL — ABNORMAL HIGH (ref 0.1–1.0)
Monocytes Relative: 18 %
Neutro Abs: 4.8 10*3/uL (ref 1.7–7.7)
Neutrophils Relative %: 55 %
Platelets: 66 10*3/uL — ABNORMAL LOW (ref 150–400)
RBC: 3.74 MIL/uL — ABNORMAL LOW (ref 4.22–5.81)
RDW: 29.5 % — ABNORMAL HIGH (ref 11.5–15.5)
WBC: 8.6 10*3/uL (ref 4.0–10.5)
nRBC: 6.8 % — ABNORMAL HIGH (ref 0.0–0.2)

## 2023-10-11 MED ORDER — DICLOFENAC SODIUM 1 % EX GEL: 2.0000 g | Freq: Four times a day (QID) | CUTANEOUS | Status: DC | PRN

## 2023-10-11 MED ORDER — HALOPERIDOL 5 MG PO TABS
5.0000 mg | ORAL_TABLET | Freq: Two times a day (BID) | ORAL | 0 refills | Status: DC
  Filled 2023-10-11: qty 60, 30d supply, fill #0

## 2023-10-11 MED ORDER — ADULT MULTIVITAMIN W/MINERALS CH
1.0000 | ORAL_TABLET | Freq: Every day | ORAL | 0 refills | Status: DC
  Filled 2023-10-11: qty 30, 30d supply, fill #0

## 2023-10-11 MED ORDER — FERROUS SULFATE 325 (65 FE) MG PO TABS
325.0000 mg | ORAL_TABLET | ORAL | 0 refills | Status: DC
  Filled 2023-10-11: qty 30, 60d supply, fill #0

## 2023-10-11 MED ORDER — FOLIC ACID 1 MG PO TABS
2.0000 mg | ORAL_TABLET | Freq: Every day | ORAL | 0 refills | Status: DC
  Filled 2023-10-11: qty 30, 15d supply, fill #0

## 2023-10-11 MED ORDER — BENZTROPINE MESYLATE 0.5 MG PO TABS
0.5000 mg | ORAL_TABLET | Freq: Every day | ORAL | 0 refills | Status: DC
  Filled 2023-10-11: qty 30, 30d supply, fill #0

## 2023-10-11 MED ORDER — ACETAMINOPHEN 500 MG PO TABS: 1000.0000 mg | ORAL_TABLET | Freq: Four times a day (QID) | ORAL | Status: DC | PRN

## 2023-10-11 MED ORDER — THIAMINE HCL 100 MG PO TABS
100.0000 mg | ORAL_TABLET | Freq: Every day | ORAL | 0 refills | Status: DC
  Filled 2023-10-11: qty 30, 30d supply, fill #0

## 2023-10-11 NOTE — Assessment & Plan Note (Addendum)
Thrombocytopenic to 16 ? 30 ? 66.  No acute events overnight.  Patient is stable to discharge.  Afebrile over past 24 hours after suspected platelet transfusion reaction.  Denies subjective fever.  Patient advised to return to the hospital if he notices frank bleeding.  Per chart review, he does not want further workup for this problem. - Hematology recs 10/14: Recommend transfusion if hemoglobin <7.0 or platelet <20 Recommend bone marrow biopsy and GI workup - Voicemail left with outpatient hematology, coordinating follow-up visit

## 2023-10-11 NOTE — TOC Progression Note (Signed)
Transition of Care [facility]) - Progression Note    Patient Details  Name: Rufino Staup MRN: [id number] Date of Birth: 1986/11/12  Transition of Care [facility]) CM/SW Contact  Bodie Abernethy A [place], [place] Phone Number: 10/11/2023, 11:29 AM  Clinical Narrative:     CSW scheduled follow up appointment for pt at [facility] outpatient.   IVC commitment change documentation sent to magistrate and submitted successfully.  Housing and Social service resources on pt's AVS.    No other needs identified at this time. TOC will sign off, please consult again if TOC needs arise.    Expected Discharge Plan: Psychiatric Hospital    Expected Discharge Plan and Services                                               Social Determinants of Health (SDOH) Interventions SDOH Screenings   Food Insecurity: No Food Insecurity (10/05/2023)  Housing: Low Risk  (10/05/2023)  Transportation Needs: No Transportation Needs (10/05/2023)  Utilities: Not At Risk (10/05/2023)  Tobacco Use: High Risk (10/05/2023)    Readmission Risk Interventions     No data to display

## 2023-10-11 NOTE — Consult Note (Addendum)
Alfred Phillips Psychiatry Consult Evaluation  Service Date: October 11, 2023 LOS:  LOS: 7 days   Primary Psychiatric Diagnoses  Bipolar disorder, current episode manic (r/o schizoaffective disorder bipolar type, r/o underlying medical condition contributing)  Assessment  Alfred Phillips is a 37 y.o. male admitted medically for 10/04/2023  2:51 PM for pancytopenia. He carries the psychiatric diagnoses of none and has a past medical history of sickle cell disease. Psychiatry was consulted for concern for psychosis by Tomie China, MD.  His initial presentation of intermittent grandiosity, delusions, paranoia, poor sleep, increased energy, increased impulsivity in the past 2 years and worsening over the past 6 months is most consistent with either psychosis due to an underlying medical condition (his iron deficiency anemia, ?autoimmune process) or a primary psychiatric diagnosis of bipolar disorder, current episode manic. No mental health history or psychiatric hospitalizations prior to this. On initial examination, patient had paranoia related to his mother, disorganized thinking related to his ability to work. He minimizes and/or denies all the symptoms leading up to his hospitalization but collateral reports that prior to admission patient was having delusions, decreased sleep, rapid speech, grandiosity, decrease in goal-directed activities. We recommended starting haldol to treat his psychosis, adding cogentin for EPS prevention given risk factors (young AA male). Patient received haldol 5mg  BID and cogentin 0.5mg  for 5 days during the hospitalization and over this time his thought content was noted to be more logical, he had increased sleep at night, he had normal speech, decreased grandiosity and he did not have any behavioral events requiring agitation protocol. He did not have increased energy or impulsivity while in the hospital. He denied side effects from the medications and there was no EPS on exam.  He continued to have what seems to be baseline mistrust of the medical system and was stating that he embraces a "holistic" approach to life. We were formally consulted for his capacity regarding receiving IV platelets and at that time he was deemed to have capacity to refuse IV platelets if he desired to do so though he had not declined at that time and did not decline platelets during his hospitalization. He denied SI/HI/AVH during his hospitalization. While future psychiatric events cannot be accurately predicted, the patient does not currently require acute inpatient psychiatric care and does not currently meet [facility] involuntary commitment criteria. While patient was initially IVC'ed in the ED, at the time of this interview, patient did not meet IVC criteria, thus his IVC was rescinded on 10/16. At time of discharge, mom was contacted and there were no acute safety concerns. He was also advised to abstain from substances in the outpatient setting. He reported that he would continue follow-up with outpatient psychiatric providers and continue his psychiatric medications with outpatient follow-up.   Please see plan below for detailed recommendations.   Diagnoses:  Active Hospital problems: Principal Problem:   Anemia of unknown etiology Active Problems:   Psychosis (HCC)   Pancytopenia (HCC)   Thrombocytopenia (HCC)   Iron deficiency anemia due to chronic blood loss   Back pain     Plan   ## Psychiatric Medication Recommendations:  -- Continue haldol 5mg  BID for psychosis  -- Continue cogentin 0.5mg  daily for EPS prevention -- Continue hydroxyzine 25mg  TID PRN for anxiety  -- Continue trazodone 50mg  PRN for sleep  -- Continue PRN agitation protocol: haldol, benadryl, ativan -- Continue IM benadryl 50mg  Q6H PRN for acute dystonic reaction   ## Medical Decision Making Capacity:  -- not  formally assessed   ## Further Work-up:  -- per primary   -- most recent EKG on 10/14 had  QtC of 445 with NSR 89bpm  -- Pertinent labwork reviewed earlier this admission includes: ethanol 218, TSH wnl, UDS wnl, iron 13, %sat 3, ferritin 3, HIV NR, RPR NR, ESR/CRP wnl, CT head wnl, hepatitis panel wnl, CBC Hgb 4 -> 5.3. WBC 3. ANA negative.     Latest Ref Rng & Units 10/11/2023    4:45 AM 10/10/2023    4:59 AM 10/09/2023    6:28 AM  CBC  WBC 4.0 - 10.5 K/uL 8.6  10.2  6.4   Hemoglobin 13.0 - 17.0 g/dL 8.5  8.4  8.4   Hematocrit 39.0 - 52.0 % 29.4  28.5  29.1   Platelets 150 - 400 K/uL 66  30  16      ## Disposition:  -- Home -- Outpatient psychiatry follow-up discussed with social work  ## Research scientist (physical sciences) / Environmental:  --   No specific recommendations at this time.   #Legal --VOL (rescind IVC 10/16)  ## Safety and Observation Level:  - Based on my clinical evaluation, I estimate the patient to be at low risk of self harm in the current setting - At this time, we recommend a routine level of observation. This decision is based on my review of the chart including patient's history and current presentation, interview of the patient, mental status examination, and consideration of suicide risk including evaluating suicidal ideation, plan, intent, suicidal or self-harm behaviors, risk factors, and protective factors. This judgment is based on our ability to directly address suicide risk, implement suicide prevention strategies and develop a safety plan while the patient is in the clinical setting. Please contact our team if there is a concern that risk level has changed.  Suicide risk assessment  Patient has following modifiable risk factors for suicide: psychosis which we are addressing by treating his psychosis.   Patient has following non-modifiable or demographic risk factors for suicide: male gender  Patient has the following protective factors against suicide: Supportive family, no history of suicide attempts, and no history of NSSIB  Thank you for this consult  request. Recommendations have been communicated to the primary team.  We will sign off at this time.   PGY-2 Karie Fetch, MD  Psychiatric and Social History   Relevant Aspects of Hospital Course:  Admitted on 10/04/2023 for pancytopenia.  -10/14 Refused bone marrow biopsy, started 1u plts, but was stopped 2/2 elevated temp  Initially brought to [facility] under IVC: "THE RESPONDENT HAS BEEN UP ALL NIGHT, HE SAYS HE DOESN'T have time to eat or sleep.  The respondent believes he is a God and the devil.  Responded has threatened to kill his family by poisoning their food.  The respondent rambles when he speaks, making nonsensical statements.  The respondent is aggressive towards me and the rest of the family.  The respondent drinks and smokes marijuana daily. IVC petitioner is Burnis Medin patient's mother." At [facility], patient stated that he was brought for "playing music at 5am"  Patient at [facility] appeared to be labile (irritable, anxious, euthymic, guarded) and speech was clear with increased volume, periods of pauses consistent with thought blocking vs selectively mute.  In the ED, he endorsed that he had not been sleeping and he told the EDP that "I cannot die" and "I am of God." He was given IM geodon in the ED.   VSS. Taking scheduled meds. PRN tylenol.  Plts 66. Fecal occult blood neg.   Patient Report 10/11/2023:  Patient reports no issues with sleep or appetite. He continues to report his mood is "straight", no changes from yesterday. When asked what brought him to the [facility], he reports that someone called the cops on him because he was making music. He denies SI/HI/AVH. He does not appear to be responding to internal or external stimuli. He reports that he will follow-up with an outpatient psychiatrist if there is transportation provided, also advised patient that some psychiatrists provide virtual services. He reports that he is taking medications due to diagnosis of bipolar disorder. Advised  patient to continue his medications and he reports "I hear you."   Collateral information:  Contacted mom Burnis Medin at [phone] on 10/11/23 Mom was contacted regarding patient's psychiatric stabilization at this time. We discussed that he has had no behavioral events while in the hospital and he has been compliant with medications while in the hospital. We discussed that he could follow-up in the outpatient setting. She reports that when he is not drinking he does not have behavioral issues. She denies any safety concerns at this time. She expressed appreciation for the call.   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 Lives with mother, is intermittently homeless. Moved to [place] from [place] Considers support system to be God. Denies having people that support him.   Access to weapons: denies   Substance History Tobacco use: yes, 1/2 ppd. Declines tobacco replacement therapy  Alcohol use: reports drinking sometimes, whenever I get it. No cravings, no withdrawals. Is not forthcoming with how much he is drinking when not in hospital  Cannabis: denies   Exam Findings   Psychiatric Specialty Exam:  Presentation  General Appearance: Appropriate for Environment  Eye Contact:Good  Speech:Clear and Coherent  Speech  Volume:Normal  Handedness:Right   Mood and Affect  Mood:Irritable  Affect:Restricted; Congruent   Thought Process  Thought Processes:Linear  Descriptions of Associations:Intact  Orientation:Full (Time, Place and Person)  Thought Content:Logical  Hallucinations:Hallucinations: None   Ideas of Reference:None  Suicidal Thoughts:Suicidal Thoughts: No   Homicidal Thoughts:Homicidal Thoughts: No    Sensorium  Memory:Immediate Fair; Recent Fair  Judgment:Intact  Insight:Present   Executive Functions  Concentration:Fair  Attention Span:Fair  Recall:Fair  Fund of Knowledge:Fair  Language:Fair   Psychomotor Activity  Psychomotor Activity:Psychomotor Activity: Normal    Assets  Assets:Resilience   Sleep  Sleep:Sleep: Good     Physical Exam: Vital signs:  Temp:  [99.4 F (37.4 C)] 99.4 F (37.4 C) (10/15 1916) Pulse Rate:  [81-92] 92 (10/16 0838) Resp:  [18] 18 (10/16 0838) BP: (107-110)/(64-73) 110/73 (10/16 0838) SpO2:  [99 %-100 %] 100 % (10/16 6962) Physical Exam Constitutional:      Appearance: Normal appearance.  HENT:     Head: Normocephalic and atraumatic.  Pulmonary:     Effort: Pulmonary effort is normal.  Skin:    General: Skin is warm and dry.  Neurological:     General: No focal deficit present.     Mental Status: He is alert.  Psychiatric:        Behavior: Behavior is cooperative.     Blood pressure 110/73, pulse 92, temperature 99.4 F (37.4 C), temperature source Oral, resp. rate 18, height 5\' 8"  (1.727 m), weight 63.5 kg, SpO2 100%. Body mass index is 21.29 kg/m.   Other History   These have been pulled in through the EMR, reviewed, and updated if appropriate.   Family History:  None The patient's family history is not on file.  Medical History: Past medical history of sickle cell trait   Surgical History: None  Medications:   Current Facility-Administered Medications:    acetaminophen (TYLENOL)  tablet 1,000 mg, 1,000 mg, Oral, Q6H PRN, Gerrit Heck, DO, 1,000 mg at 10/10/23 2133   benztropine (COGENTIN) tablet 0.5 mg, 0.5 mg, Oral, Daily, Karie Fetch, MD, 0.5 mg at 10/11/23 1610   diclofenac Sodium (VOLTAREN) 1 % topical gel 2 g, 2 g, Topical, QID PRN, Gerrit Heck, DO   haloperidol (HALDOL) tablet 5 mg, 5 mg, Oral, TID PRN **AND** LORazepam (ATIVAN) tablet 2 mg, 2 mg, Oral, TID PRN **AND** diphenhydrAMINE (BENADRYL) capsule 50 mg, 50 mg, Oral, TID PRN, Massengill, Harrold Donath, MD   haloperidol lactate (HALDOL) injection 5 mg, 5 mg, Intramuscular, TID PRN **AND** LORazepam (ATIVAN) injection 2 mg, 2 mg, Intramuscular, TID PRN **AND** diphenhydrAMINE (BENADRYL) injection 50 mg, 50 mg, Intramuscular, TID PRN, Massengill, Harrold Donath, MD   diphenhydrAMINE (BENADRYL) injection 50 mg, 50 mg, Intramuscular, Q6H PRN, Karie Fetch, MD   ferrous sulfate tablet 325 mg, 325 mg, Oral, QODAY, Dahbura, Anton, DO, 325 mg at 10/11/23 9604   folic acid (FOLVITE) tablet 2 mg, 2 mg, Oral, Daily, 2 mg at 10/11/23 5409 **OR** folic acid 1 mg in sodium chloride 0.9 % 50 mL IVPB, 1 mg, Intravenous, Daily, Dameron, Marisa, DO   haloperidol (HALDOL) tablet 5 mg, 5 mg, Oral, Q12H, Massengill, Nathan, MD, 5 mg at 10/11/23 8119   hydrOXYzine (ATARAX) tablet 25 mg, 25 mg, Oral, TID PRN, Massengill, Harrold Donath, MD   multivitamin with minerals tablet 1 tablet, 1 tablet, Oral, Daily, Shelby Mattocks, DO, 1 tablet at 10/11/23 1478   thiamine (VITAMIN B1) tablet 100 mg, 100 mg, Oral, Daily, 100 mg at 10/11/23 2956 **OR** thiamine (VITAMIN B1) injection 100 mg, 100 mg, Intravenous, Daily, Dahbura, Anton, DO   traZODone (DESYREL) tablet 50 mg, 50 mg, Oral, QHS PRN, Phineas Inches, MD  Allergies: No Known Allergies

## 2023-10-11 NOTE — Progress Notes (Signed)
10/11/2023  Alfred Phillips DOB: 15-Oct-1986 MRN: [id number]   RIDER WAIVER AND RELEASE OF LIABILITY  For the purposes of helping with transportation needs, [name] partners with outside transportation providers (taxi companies, [name], Catering manager.) to give [name] patients or other approved people the choice of on-demand rides [employer]") to our buildings for non-emergency visits.  By using [employer], I, the person signing this document, on behalf of myself and/or any legal minors (in my care using the [employer]), agree:  Science writer given to me are supplied by independent, outside transportation providers who do not work for, or have any affiliation with, [name]. [name] is not a transportation company. [name] has no control over the quality or safety of the rides I get using [employer]. [name] has no control over whether any outside ride will happen on time or not. [facility] gives no guarantee on the reliability, quality, safety, or availability on any rides, or that no mistakes will happen. I know and accept that traveling by vehicle (car, truck, SVU, Zenaida Niece, bus, taxi, etc.) has risks of serious injuries such as disability, being paralyzed, and death. I know and agree the risk of using [employer] is mine alone, and not [name]. Transport Services are provided "as is" and as are available. The transportation providers are in charge for all inspections and care of the vehicles used to provide these rides. I agree not to take legal action against [name], its agents, employees, officers, directors, representatives, insurers, attorneys, assigns, successors, subsidiaries, and affiliates at any time for any reasons related directly or indirectly to using [employer]. I also agree not to take legal action against [name] or its affiliates for any injury, death, or damage to property caused by or related to using  [employer]. I have read this Waiver and Release of Liability, and I understand the terms used in it and their legal meaning. This Waiver is freely and voluntarily given with the understanding that my right (or any legal minors) to legal action against [facility] relating to [employer] is knowingly given up to use these services.   I attest that I read the Ride Waiver and Release of Liability to [name], gave [name] the opportunity to ask questions and answered the questions asked (if any). I affirm that [name] then provided consent for assistance with transportation.

## 2023-10-11 NOTE — Progress Notes (Addendum)
Daily Progress Note Intern Pager: [phone]  Patient name: Alfred Phillips Medical record number: [id number] Date of birth: 1986-07-04 Age: 37 y.o. Gender: male  Primary Care Provider: Pcp, No Consultants: Hematology, psychiatry Code Status: Full  Pt Overview and Major Events to Date:   Alfred Phillips is a 37 year old male admitted for acute psychosis (under IVC until 10/16) and pancytopenia possibly secondary to severe iron deficiency anemia.    Negative labs for various etiologies of pancytopenia, including Hepatitis/HIV/RPR, sed rate/CRP, B12, and ANA. Hemoglobin improved and stable >7 after 4 units of blood and IV iron infusion with no obvious signs of active bleeding.   Psychiatry following and treating psychosis with haldol and cogentin.  Presentation is most consistent with bipolar disorder versus schizoaffective disorder bipolar type.  For further detail, see collateral call with mother and psychiatry progress note 10/11. Pt has not required PRN psychiatry medications during admission.  We agree that patient likely has capacity to make decisions concerning his medical issues and does not want bone marrow biopsy, limiting options for workup. Psychiatry rescinding IVC paperwork, as patient is psychiatrically stable and no longer qualifies.  To be discharged on current psych medications.  Medically stable.  Close outpatient follow-up with hematology/psych is being set up. Assessment & Plan Thrombocytopenia (HCC) Thrombocytopenic to 16 ? 30 ? 66.  No acute events overnight.  Patient is stable to discharge.  Afebrile over past 24 hours after suspected platelet transfusion reaction.  Denies subjective fever.  Patient advised to return to the hospital if he notices frank bleeding.  Per chart review, he does not want further workup for this problem. - Hematology recs 10/14: Recommend transfusion if hemoglobin <7.0 or platelet <20 Recommend bone marrow biopsy and GI workup - Voicemail left with  outpatient hematology, coordinating follow-up visit Psychosis [facility]) Patient IVC will will not be renewed.  Per preliminary psych note/communication: - Social work set up [employer] psychiatry appointment, housing resources - Preliminary psychiatry recs as follows:  Continue on discharge: Haldol 5mg  BID for psychosis with concurrent Cogentin 0.5mg  for EPS control - Patient has capacity to refuse IV platelets/bone marrow biopsy.   - 1:1 continuous monitoring ordered Anemia of unknown etiology Hemoglobin: 8.4 ? 8.4 ? 8.5.  FOBT negative for occult blood. CT abdomen and pelvis negative.  Patient refusing colonoscopy at this time. - Continue to trend CBCs qDay and monitor for frank bleeding - Transfuse pRBCs @ <7.0 Back pain Resolved. - Continue Tylenol as needed - Continue to monitor  Chronic and Stable Issues:  Alcohol: EtOH 281 on admit.  No signs of withdrawal.  DC'd CIWAs after 72 hours.  Vitamin supplementation as above.   FEN/GI: Regular PPx: SCDs Dispo: Pending clinical improvement  Subjective:   No acute events overnight.  On interview, patient denies new issues.  Feeling "okay".  Denies frank blood in the stool and fevers.  Patient advised to keep an eye out for bleeding outside hospital.  Objective:  BP: 107/64 HR: 81 RR: 18 T: 99.4 O2sat: 99% on room air  Significant vitals over past 24 hours:   Physical Exam:  General: Well-appearing male sitting up in bed, NAD, attentive. Cardiovascular: RRR no m/r/g. Respiratory: CTAB. No w/r/r.  Abdomen: No tenderness in 4 quadrants. BS+.  Extremities: ROM intact.  Nonedematous.  Basic labs:  Most recent CBC Lab Results  Component Value Date   WBC 8.6 10/11/2023   HGB 8.5 (L) 10/11/2023   HCT 29.4 (L) 10/11/2023   MCV 78.6 (L) 10/11/2023  PLT 66 (L) 10/11/2023   Most recent BMP    Latest Ref Rng & Units 10/10/2023    4:59 AM  BMP  Glucose 70 - 99 mg/dL 86   BUN 6 - 20 mg/dL 7   Creatinine 5.78 - 4.69 mg/dL 6.29    Sodium 528 - 413 mmol/L 138   Potassium 3.5 - 5.1 mmol/L 4.1   Chloride 98 - 111 mmol/L 104   CO2 22 - 32 mmol/L 25   Calcium 8.9 - 10.3 mg/dL 9.3     Other pertinent labs:  FOBT: Negative UA: Unremarkable CBC with differential: Unremarkable  Imaging/Diagnostic Tests:  CT abdomen and pelvis with contrast:  IMPRESSION: 1. Trace free fluid in the pelvis. 2. No other acute process in the chest, abdomen or pelvis.  Tomie China, MD 10/11/2023, 7:24 AM  PGY-1, [facility] FPTS Intern pager: [phone], text pages welcome Secure chat group [facility] Teaching Service

## 2023-10-11 NOTE — Assessment & Plan Note (Deleted)
Hemoglobin 8.4 ? 8.4, stable.  WBC to 6.4.  Platelets finally above the infusion threshold s/p 3 units.  Likeliest etiology is iron deficiency anemia secondary to occult bleed.  Does not explain poor marrow output.  Reticulocyte/platelet/WBC levels improving with iron infusion, but not at rate expected. - Replete if hemoglobin <7.0 as above.

## 2023-10-11 NOTE — Discharge Summary (Addendum)
Family Medicine Teaching [facility] Discharge Summary  Patient name: Alfred Phillips Medical record number: [id number] Date of birth: Jun 05, 1986 Age: 37 y.o. Gender: male Date of Admission: 10/04/2023  Date of Discharge: 10/11/2023 Admitting Physician: Shelby Mattocks, DO  Primary Care Provider: Pcp, No Consultants: Psychiatry, hematology  Indication for Hospitalization: Severe pancytopenia  Discharge Diagnoses/Problem List:  Principal Problem for Admission: Pancytopenia, psychosis under IVC Other Problems addressed during stay:  Principal Problem:   Anemia of unknown etiology Active Problems:   Psychosis (HCC)   Pancytopenia (HCC)   Thrombocytopenia (HCC)   Iron deficiency anemia due to chronic blood loss   Back pain  Brief Hospital Course:  Alfred Phillips is a 37 y.o. male who was admitted to the [facility] Medicine Teaching Service at [facility] for pancytopenia. Hospital course is outlined below by problem.   Pancytopenia Presented with suspected psychosis as below.  Alfred Phillips.  Found to have hemoglobin 4.0, WBC 2.2, platelets 72.  Ferritin 3.  Vitamin B12, CRP, sed rate, hepatitis panel, ANA negative.  Blood smear with severe microcytic hypochromic anemia with anisopoikilocytosis compatible with IDA. He was ultimately given 4 units of blood over admission.  Documented history of sickle cell anemia.  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  Heme/onc consulted who felt pancytopenia likely due to profound iron deficiency anemia.  IV Iron x1 10/10 and PO iron 325 thereafter in addition to iron in units of pRBCs.  FOBT unremarkable.  CT A/P: Negative for obvious malignancy/acute process.  At no point did patient show any frank bleeding or any other anemia symptoms .  Hemoglobin improved to 8.5 on discharge and patient desired to go home. Patient given number to call hematology office to schedule follow up.  Thrombocytopenia, profound Patient found thrombocytopenic to <20 10/12.  Was  given 3 units of platelets, remained within 12-16 range.  Attempted a fourth unit when patient developed an elevated temperature (100.2). Transfusion reaction protocol was initiated.  Hematology recommended bone marrow biopsy, patient refused.  Improved at discharge: 16 ? 30 ? 66.  Hematology signed off and advised outpatient follow up as above.  Psychosis Noted to be manic and experiencing religious delusions which prompted presentation to [facility]  Alcohol level elevated as below. Patient's medical screening demonstrated pancytopenia as above which prompted transfer to the ED.  Psychiatry was consulted who IVC'd patient and gave Geodon with improvement in mood/affect.  Patient was evaluated for capacity to make decisions: Expressed insight concerning medical conditions, was found to have capacity to refuse workup/treatment.  However, patient was not found to have capacity to refuse psychiatric medications.  Begun on Haldol 5 mg daily with concurrent Cogentin 0.5 daily. Toward end of admission, IVC was not renewed as he no longer met criteria for involuntary commitment.  Was discharged on these medications.  Psych follow-up placed 11/11.  Alcohol use Alcohol level on admission 218.  CIWA was placed along with MVI, thiamine, folic acid and remained low.  No Ativan was needed over admission.  Issues for follow up: Ensure continued follow-up with psychiatry and hematology Recommend recheck CBC Can make Dr. Phineas Real PCP should he desire to stay with [facility]  Disposition: Home  Discharge Condition: Stable  Discharge Exam:  Vitals:   10/10/23 1916 10/11/23 0838  BP: 107/64 110/73  Pulse: 81 92  Resp: 18 18  Temp: 99.4 F (37.4 C)   SpO2: 99% 100%   General: Well-appearing male sitting up in bed, NAD, attentive. Cardiovascular: RRR no m/r/g. Respiratory: CTAB.  No w/r/r.  Abdomen: No tenderness in 4 quadrants. BS+.  Extremities: ROM intact.  Nonedematous.  Significant Procedures: None  Significant  Labs and Imaging:  Recent Labs  Lab 10/10/23 0459 10/11/23 0445  WBC 10.2 8.6  HGB 8.4* 8.5*  HCT 28.5* 29.4*  PLT 30* 66*   Recent Labs  Lab 10/10/23 0459  NA 138  K 4.1  CL 104  CO2 25  GLUCOSE 86  BUN 7  CREATININE 0.90  CALCIUM 9.3    Pertinent imaging:  CT head without contrast (10/9):  IMPRESSION: Negative non contrasted CT appearance of the brain.  XR Chest (10/10):  IMPRESSION: No active disease.  US Spleen (10/14):  IMPRESSION:  Normal caliber spleen today by ultrasound. Overall slightly larger however compared to the prior CT scan  CT abdomen and pelvis with contrast (10/15):  IMPRESSION: 1. Trace free fluid in the pelvis. 2. No other acute process in the chest, abdomen or pelvis.    Results/Tests Pending at Time of Discharge: None  Discharge Medications:  Allergies as of 10/11/2023   No Known Allergies      Medication List     TAKE these medications    acetaminophen 500 MG tablet Commonly known as: TYLENOL Take 2 tablets (1,000 mg total) by mouth every 6 (six) hours as needed for moderate pain (pain score 4-6).   benztropine 0.5 MG tablet Commonly known as: COGENTIN Take 1 tablet (0.5 mg total) by mouth daily. Start taking on: October 12, 2023   diclofenac Sodium 1 % Gel Commonly known as: VOLTAREN Apply 2 g topically 4 (four) times daily as needed (low back pain).   ferrous sulfate 325 (65 FE) MG tablet Take 1 tablet (325 mg total) by mouth every other day.   folic acid 1 MG tablet Commonly known as: FOLVITE Take 2 tablets (2 mg total) by mouth daily.   haloperidol 5 MG tablet Commonly known as: HALDOL Take 1 tablet (5 mg total) by mouth every 12 (twelve) hours.   multivitamin with minerals Tabs tablet Take 1 tablet by mouth daily.   thiamine 100 MG tablet Commonly known as: VITAMIN B1 Take 1 tablet (100 mg total) by mouth daily.         Discharge Instructions: Please refer to Patient Instructions section of  EMR for full details.  Patient was counseled important signs and symptoms that should prompt return to medical care, changes in medications, dietary instructions, activity restrictions, and follow up appointments.   Follow-Up Appointments:  Follow-up Information     [facility] Follow up on 11/06/2023.   Specialty: Behavioral Health Why: You have a follow up appointment scheduled for Monday November 11th at 2pm. Please arrive at 1:30pm to with ID and insurance card. Please contact if needed to reschedule. Thank you! Contact information: [address] [place] [address] [phone]        Bess Kinds, MD. Go on 10/13/2023.   Specialty: Family Medicine Why: at 2:50 PM Contact information: [address] [place] [address] [phone]                 Luiz Iron, MD PGY-1, [facility] Family Medicine  I agree with the assessment and plan as documented in the resident's note.  Janeal Holmes, MD                  10/11/2023, 12:36 PM

## 2023-10-11 NOTE — Plan of Care (Signed)

## 2023-10-11 NOTE — Assessment & Plan Note (Addendum)
Resolved. - Continue Tylenol as needed - Continue to monitor

## 2023-10-11 NOTE — Assessment & Plan Note (Addendum)
Hemoglobin: 8.4 ? 8.4 ? 8.5.  FOBT negative for occult blood. CT abdomen and pelvis negative.  Patient refusing colonoscopy at this time. - Continue to trend CBCs qDay and monitor for frank bleeding - Transfuse pRBCs @ <7.0

## 2023-10-11 NOTE — Progress Notes (Signed)
The patient is well-appearing with no abnormal findings on exam. I have reviewed his labs and CT chest, abdomen and pelvis. His blood cell counts have remained stable, and his platelet count is at 66, which is encouraging. He declined both the bone marrow biopsy and colonoscopy. His CT scans of the chest, abdomen, and pelvis show no signs of malignancy or tumors. I reached out to Dr. Leonides Schanz via secure chat to discuss his care plan from a hematology perspective. Per Dr. Leonides Schanz, they have no further recs, especially since his platelet is now rising. Outpatient f/u with hematology is recommended. The patient is to contact their office @ [phone] for a follow-up appointment. He is medically optimized for inpatient psychiatry admission. The resident in charge of this patient will reach out to Psychiatry for transfer.

## 2023-10-11 NOTE — Assessment & Plan Note (Addendum)
Patient IVC will will not be renewed.  Per preliminary psych note/communication: - Social work set up [employer] psychiatry appointment, housing resources - Preliminary psychiatry recs as follows:  Continue on discharge: Haldol 5mg  BID for psychosis with concurrent Cogentin 0.5mg  for EPS control - Patient has capacity to refuse IV platelets/bone marrow biopsy.   - 1:1 continuous monitoring ordered

## 2023-10-11 NOTE — Discharge Instructions (Addendum)
Dear Glenis Smoker Tunks,   Thank you for letting us participate in your care! You had pancytopenia (low blood counts) which was treated with blood transfusions and iron transfusions. You were also followed by the hematology/oncology doctors who specialize in blood disorders.   POST-HOSPITAL & CARE INSTRUCTIONS Please take your iron pill (ferrous sulfate 325 mg) daily. Please let PCP/Specialists know of any changes in medications that were made. You have a follow up with our family medicine clinic (below) before your follow up with psychiatry. You will need to make your appointment with the hematologist. It is important to follow up to ensure you stay healthy. Please see medications section of this packet for any medication changes.   DOCTOR'S APPOINTMENTS & FOLLOW UP Future Appointments  Date Time Provider Department Center  10/13/2023  2:50 PM Bess Kinds, MD [facility]   Psychiatry appointment: 11/11 at 1:30 PM at [address] [place] [address]   CALL the hematologist to schedule a follow up appointment: [phone]   Thank you for choosing [facility]! Take care and be well!  Family Medicine Teaching Service Inpatient Team [place]  [facility]  [address] [place], [place] [address] [phone]

## 2023-10-12 ENCOUNTER — Telehealth: Payer: Self-pay

## 2023-10-12 ENCOUNTER — Encounter (HOSPITAL_COMMUNITY): Payer: Self-pay

## 2023-10-12 NOTE — Transitions of Care (Post Inpatient/ED Visit) (Signed)
10/12/2023  Name: Alfred Phillips MRN: [id number] DOB: 1986/11/08  Today's TOC FU Call Status: Today's TOC FU Call Status:: Successful TOC FU Call Completed TOC FU Call Complete Date: 10/12/23 Patient's Name and Date of Birth confirmed.  Transition Care Management Follow-up Telephone Call Date of Discharge: 10/11/23 Discharge Facility: [name]) Type of Discharge: Inpatient Admission Primary Inpatient Discharge Diagnosis:: Pancytopenia and Psychosis How have you been since you were released from the hospital?: Same Any questions or concerns?: Yes (Talked with the patient's mother) Patient Questions/Concerns:: The patient's mother would like to address the excessive alcohol consumption Patient Questions/Concerns Addressed: Notified Provider of Patient Questions/Concerns  Items Reviewed: Did you receive and understand the discharge instructions provided?: Yes Any new allergies since your discharge?: No Dietary orders reviewed?: NA Do you have support at home?: Yes People in Home: sibling(s), parent(s) Name of Support/Comfort Primary Source: Burnis Medin  Medications Reviewed Today: Medications Reviewed Today   Medications were not reviewed in this encounter     Home Care and Equipment/Supplies: Were Home Health Services Ordered?: NA Any new equipment or medical supplies ordered?: NA  Functional Questionnaire: Do you need assistance with bathing/showering or dressing?: No Do you need assistance with meal preparation?: No Do you need assistance with eating?: No Do you have difficulty maintaining continence: No Do you need assistance with getting out of bed/getting out of a chair/moving?: No Do you have difficulty managing or taking your medications?: No  Follow up appointments reviewed: PCP Follow-up appointment confirmed?: Yes Date of PCP follow-up appointment?: 10/13/23 Follow-up Provider: Bess Kinds Specialist [facility] Follow-up appointment confirmed?:  NA Do you need transportation to your follow-up appointment?: Yes Transportation Need Intervention Addressed By:: AMB Referral For SDOH Needs Do you understand care options if your condition(s) worsen?: Yes-patient verbalized understanding  SDOH Interventions Today    Flowsheet Row Most Recent Value  SDOH Interventions   Food Insecurity Interventions Intervention Not Indicated  Transportation Interventions Intervention Not Indicated      Talked with the patient's mother. The patient is not answering the phone. The patient's mother is concerned regarding his drinking and his psychosis episodes. She would like him committed to a Alcohol detox center or inpatient psychiatric facility. The patient is disruptive at home. He does not work nor contribute to the household. The patient's mother is stress regarding this home situation. The patient has a follow up PCP appointment on 10/18.24 and she does not know how he will get to the appointment because she did not know about it and she works. He does not have reliable transportation. AMB referral to LCSW for assistance.   .chs

## 2023-10-13 ENCOUNTER — Telehealth: Payer: Self-pay | Admitting: Licensed Clinical Social Worker

## 2023-10-13 ENCOUNTER — Encounter: Payer: Self-pay | Admitting: Licensed Clinical Social Worker

## 2023-10-13 ENCOUNTER — Ambulatory Visit: Payer: Self-pay | Admitting: Student

## 2023-10-13 NOTE — Progress Notes (Deleted)
  SUBJECTIVE:   CHIEF COMPLAINT / HPI:   Hospital F/u -Admitted 10/9-10/16 for psychosis w/ religious delusions, alcohol abuse,  -Also had Pancytopenia from severe IDA, and severe thrombocytopenia, -Transfused PRBC x4 Plt x 3 > Transfusion Reaction, IV Fe x1 -HgbSS, homeless  Psychosis -meds: Cogentin 5 mg, Haldol 5 mg -F/u 11/11 1:30 pm [facility]   Pancytopenia / Transfusion Reaction -Meds: ferrous sulfate 325 mg, MVI, Thiamine 100 mg, Folate 1mg  -F/u Heme    PERTINENT  PMH / PSH: ***  Past Medical History:  Diagnosis Date   Osteomyelitis (HCC)    Sickle cell anemia (HCC)    OBJECTIVE:  There were no vitals taken for this visit. Physical Exam   ASSESSMENT/PLAN:  There are no diagnoses linked to this encounter. No follow-ups on file. Bess Kinds, MD 10/13/2023, 1:00 PM PGY-***, [facility] Family Medicine {    This will disappear when note is signed, click to select method of visit    :1}

## 2023-10-13 NOTE — Patient Instructions (Incomplete)
It was great to see you! Thank you for allowing me to participate in your care!  I recommend that you always bring your medications to each appointment as this makes it easy to ensure we are on the correct medications and helps [place] not miss when refills are needed.  Our plans for today:  - *** -   Psychiatry [facility]) [address] [place] [address] [place] [address] [phone]  Heme Onc Dr. Jeanie Sewer [facility] [address] [place], [place] [address] [phone]  We are checking some labs today, I will call you if they are abnormal will send you a MyChart message or a letter if they are normal.  If you do not hear about your labs in the next 2 weeks please let us know.***  Take care and seek immediate care sooner if you develop any concerns.   Dr. Bess Kinds, MD [facility] Medicine

## 2023-10-13 NOTE — Patient Outreach (Signed)
Care Coordination   Initial Visit Note   10/13/2023 Name: RANALD BATZ MRN: [id number] DOB: [birth year]  Rich Fuchs Doughty is a 37 y.o. year old male who sees Pcp, No for primary care. I spoke with  patient's mother by phone today.  What matters to the patients health and wellness today?  Mother request emotional support to assist pt with mental health concerns.     Goals Addressed             This Visit's Progress    Care Coordination       Activities and task to complete in order to accomplish goals.   EMOTIONAL / MENTAL HEALTH SUPPORT Contact [place] [facility] for transportation assistance.  Visit [facility] [address] for further [facility] concerns          SDOH assessments and interventions completed:  Yes     Care Coordination Interventions:  Yes, provided  Interventions Today    Flowsheet Row Most Recent Value  Mental Health Interventions   Mental Health Discussed/Reviewed Mental Health Discussed, Other  [Spk with pt mother for behavorial healt support. Advise to contact [facility] [address] for additional assistance and support.]       Follow up plan: No further intervention required.   Encounter Outcome:  Patient Visit Completed    Gwyndolyn Saxon MSW, LCSW Licensed Clinical Social Worker  [facility], Population Health Direct Dial: [phone]  Fax: [phone]

## 2023-11-02 ENCOUNTER — Inpatient Hospital Stay: Payer: Self-pay | Admitting: Hematology and Oncology

## 2023-11-02 ENCOUNTER — Inpatient Hospital Stay: Payer: Self-pay | Attending: Internal Medicine

## 2023-11-02 ENCOUNTER — Other Ambulatory Visit: Payer: Self-pay | Admitting: Hematology and Oncology

## 2023-11-02 DIAGNOSIS — D5701 Hb-SS disease with acute chest syndrome: Secondary | ICD-10-CM

## 2023-11-02 DIAGNOSIS — D500 Iron deficiency anemia secondary to blood loss (chronic): Secondary | ICD-10-CM

## 2023-11-02 NOTE — Progress Notes (Deleted)
[facility] [phone]   [phone]  PROGRESS NOTE  Patient Care Team: Pcp, No as PCP - General Rounding, Sickle Cell, MD as Rounding Team (Internal Medicine) Patient, No Pcp Per (General Practice)  Hematological/Oncological History # ***  Interval History:  Alfred Phillips 37 y.o. male with medical history significant for *** presents for a follow up visit. The patient's last visit was on ***. In the interim since the last visit ***  MEDICAL HISTORY:  Past Medical History:  Diagnosis Date   Osteomyelitis (HCC)    Sickle cell anemia (HCC)     SURGICAL HISTORY: No past surgical history on file.  SOCIAL HISTORY: Social History   Socioeconomic History   Marital status: Single    Spouse name: Not on file   Number of children: Not on file   Years of education: Not on file   Highest education level: Not on file  Occupational History   Not on file  Tobacco Use   Smoking status: Every Day    Current packs/day: 0.50    Types: Cigarettes   Smokeless tobacco: Never  Vaping Use   Vaping status: Never Used  Substance and Sexual Activity   Alcohol use: Yes    Comment: sometimes   Drug use: No   Sexual activity: Not Currently  Other Topics Concern   Not on file  Social History Narrative   ** Merged History Encounter **       Social Determinants of Health   Financial Resource Strain: Medium Risk (10/15/2019)   Overall Financial Resource Strain (CARDIA)    Difficulty of Paying Living Expenses: Somewhat hard  Food Insecurity: No Food Insecurity (10/12/2023)   Hunger Vital Sign    Worried About Running Out of Food in the Last Year: Never true    Ran Out of Food in the Last Year: Never true  Transportation Needs: No Transportation Needs (10/12/2023)   PRAPARE - Administrator, Civil Service (Medical): No    Lack of Transportation (Non-Medical): No  Physical Activity: Sufficiently Active (10/15/2019)   Exercise Vital Sign     Days of Exercise per Week: 3 days    Minutes of Exercise per Session: 60 min  Stress: No Stress Concern Present (10/15/2019)   [employer] of Occupational Health - Occupational Stress Questionnaire    Feeling of Stress : Only a little  Social Connections: Unknown (10/15/2019)   Social Connection and Isolation Panel [NHANES]    Frequency of Communication with Friends and Family: Patient declined    Frequency of Social Gatherings with Friends and Family: Patient declined    Attends Religious Services: Patient declined    Database administrator or Organizations: Patient declined    Attends Banker Meetings: Patient declined    Marital Status: Patient declined  Intimate Partner Violence: Not At Risk (10/05/2023)   Humiliation, Afraid, Rape, and Kick questionnaire    Fear of Current or Ex-Partner: No    Emotionally Abused: No    Physically Abused: No    Sexually Abused: No    FAMILY HISTORY: No family history on file.  ALLERGIES:  has No Known Allergies.  MEDICATIONS:  Current Outpatient Medications  Medication Sig Dispense Refill   acetaminophen (TYLENOL) 500 MG tablet Take 2 tablets (1,000 mg total) by mouth every 6 (six) hours as needed for moderate pain (pain score 4-6).     benztropine (COGENTIN) 0.5 MG tablet Take 1 tablet (0.5 mg total) by mouth daily.  30 tablet 0   diclofenac Sodium (VOLTAREN) 1 % GEL Apply 2 g topically 4 (four) times daily as needed (low back pain).     ferrous sulfate 325 (65 FE) MG tablet Take 1 tablet (325 mg total) by mouth daily with breakfast. 30 tablet 3   ferrous sulfate 325 (65 FE) MG tablet Take 1 tablet (325 mg total) by mouth every other day. 30 tablet 0   folic acid (FOLVITE) 1 MG tablet Take 2 tablets (2 mg total) by mouth daily. 30 tablet 0   haloperidol (HALDOL) 5 MG tablet Take 1 tablet (5 mg total) by mouth every 12 (twelve) hours. 60 tablet 0   Multiple Vitamin (MULTIVITAMIN WITH MINERALS) TABS tablet Take 1 tablet by  mouth daily. 30 tablet 0   thiamine (VITAMIN B1) 100 MG tablet Take 1 tablet (100 mg total) by mouth daily. 30 tablet 0   No current facility-administered medications for this visit.    REVIEW OF SYSTEMS:   Constitutional: ( - ) fevers, ( - )  chills , ( - ) night sweats Eyes: ( - ) blurriness of vision, ( - ) double vision, ( - ) watery eyes Ears, nose, mouth, throat, and face: ( - ) mucositis, ( - ) sore throat Respiratory: ( - ) cough, ( - ) dyspnea, ( - ) wheezes Cardiovascular: ( - ) palpitation, ( - ) chest discomfort, ( - ) lower extremity swelling Gastrointestinal:  ( - ) nausea, ( - ) heartburn, ( - ) change in bowel habits Skin: ( - ) abnormal skin rashes Lymphatics: ( - ) new lymphadenopathy, ( - ) easy bruising Neurological: ( - ) numbness, ( - ) tingling, ( - ) new weaknesses Behavioral/Psych: ( - ) mood change, ( - ) new changes  All other systems were reviewed with the patient and are negative.  PHYSICAL EXAMINATION: ECOG PERFORMANCE STATUS: {CHL ONC ECOG PS:[phone]}  There were no vitals filed for this visit. There were no vitals filed for this visit.  GENERAL: alert, no distress and comfortable SKIN: skin color, texture, turgor are normal, no rashes or significant lesions EYES: conjunctiva are pink and non-injected, sclera clear OROPHARYNX: no exudate, no erythema; lips, buccal mucosa, and tongue normal  NECK: supple, non-tender LYMPH:  no palpable lymphadenopathy in the cervical, axillary or inguinal LUNGS: clear to auscultation and percussion with normal breathing effort HEART: regular rate & rhythm and no murmurs and no lower extremity edema ABDOMEN: soft, non-tender, non-distended, normal bowel sounds Musculoskeletal: no cyanosis of digits and no clubbing  PSYCH: alert & oriented x 3, fluent speech NEURO: no focal motor/sensory deficits  LABORATORY DATA:  I have reviewed the data as listed    Latest Ref Rng & Units 10/11/2023    4:45 AM 10/10/2023     4:59 AM 10/09/2023    6:28 AM  CBC  WBC 4.0 - 10.5 K/uL 8.6  10.2  6.4   Hemoglobin 13.0 - 17.0 g/dL 8.5  8.4  8.4   Hematocrit 39.0 - 52.0 % 29.4  28.5  29.1   Platelets 150 - 400 K/uL 66  30  16        Latest Ref Rng & Units 10/10/2023    4:59 AM 10/06/2023    7:30 AM 10/05/2023    2:33 AM  CMP  Glucose 70 - 99 mg/dL 86  454  76   BUN 6 - 20 mg/dL 7  6  6    Creatinine 0.61 - 1.24 mg/dL  0.90  0.97  0.79   Sodium 135 - 145 mmol/L 138  139  141   Potassium 3.5 - 5.1 mmol/L 4.1  4.1  3.9   Chloride 98 - 111 mmol/L 104  108  108   CO2 22 - 32 mmol/L 25  24  23    Calcium 8.9 - 10.3 mg/dL 9.3  8.6  8.4   Total Protein 6.5 - 8.1 g/dL  6.1  5.6   Total Bilirubin 0.3 - 1.2 mg/dL  1.0  0.7   Alkaline Phos 38 - 126 U/L  42  35   AST 15 - 41 U/L  21  18   ALT 0 - 44 U/L  12  10     No results found for: "MPROTEIN" No results found for: "KPAFRELGTCHN", "LAMBDASER", "KAPLAMBRATIO"   BLOOD FILM: *** Review of the peripheral blood smear showed normal appearing white cells with neutrophils that were appropriately lobated and granulated. There was no predominance of bi-lobed or hyper-segmented neutrophils appreciated. No Dohle bodies were noted. There was no left shifting, immature forms or blasts noted. Lymphocytes remain normal in size without any predominance of large granular lymphocytes. Red cells show no anisopoikilocytosis, macrocytes , microcytes or polychromasia. There were no schistocytes, target cells, echinocytes, acanthocytes, dacrocytes, or stomatocytes.There was no rouleaux formation, nucleated red cells, or intra-cellular inclusions noted. The platelets are normal in size, shape, and color without any clumping evident.  RADIOGRAPHIC STUDIES: I have personally reviewed the radiological images as listed and agreed with the findings in the report. CT CHEST ABDOMEN PELVIS W CONTRAST  Result Date: 10/11/2023 CLINICAL DATA:  Pancytopenia. Concern for GI bleed. History of sickle  cell. EXAM: CT CHEST, ABDOMEN, AND PELVIS WITH CONTRAST TECHNIQUE: Multidetector CT imaging of the chest, abdomen and pelvis was performed following the standard protocol during bolus administration of intravenous contrast. RADIATION DOSE REDUCTION: This exam was performed according to the departmental dose-optimization program which includes automated exposure control, adjustment of the mA and/or kV according to patient size and/or use of iterative reconstruction technique. CONTRAST:  75mL OMNIPAQUE IOHEXOL 350 MG/ML SOLN COMPARISON:  CT abdomen and pelvis 10/13/2019. FINDINGS: CT CHEST FINDINGS Cardiovascular: No significant vascular findings. Normal heart size. No pericardial effusion. Mediastinum/Nodes: No enlarged mediastinal, hilar, or axillary lymph nodes. Thyroid gland, trachea, and esophagus demonstrate no significant findings. Lungs/Pleura: There is minimal atelectasis in the lung bases. The lungs are otherwise clear. Pleural effusion or pneumothorax. No Musculoskeletal: No chest wall mass or suspicious bone lesions identified. CT ABDOMEN PELVIS FINDINGS Hepatobiliary: No focal liver abnormality is seen. No gallstones, gallbladder wall thickening, or biliary dilatation. Pancreas: Unremarkable. No pancreatic ductal dilatation or surrounding inflammatory changes. Spleen: Normal in size without focal abnormality. Adrenals/Urinary Tract: Adrenal glands are unremarkable. Kidneys are normal, without renal calculi, focal lesion, or hydronephrosis. Bladder is unremarkable. Stomach/Bowel: Stomach is within normal limits. Appendix is not seen. No evidence of bowel wall thickening, distention, or inflammatory changes. Vascular/Lymphatic: No significant vascular findings are present. No enlarged abdominal or pelvic lymph nodes. Reproductive: Prostate is unremarkable. Other: There is trace free fluid in the pelvis. There is a small fat containing umbilical hernia. Musculoskeletal: No acute or significant osseous  findings. IMPRESSION: 1. Trace free fluid in the pelvis. 2. No other acute process in the chest, abdomen or pelvis. Electronically Signed   By: Darliss Cheney M.D.   On: 10/11/2023 00:05   US SPLEEN (ABDOMEN LIMITED)  Result Date: 10/09/2023 CLINICAL DATA:  Thrombocytopenia EXAM: ULTRASOUND ABDOMEN LIMITED COMPARISON:  CT 10/13/2019 FINDINGS: Specific imaging of the spleen is performed. Splenic dimension of 11.7 x 10.8 x 3.8 cm. Homogeneous echotexture. No adjacent fluid. Previously the spleen on CT in 2020 measured 9.6 x 3.6 by 9.0 cm. Slightly larger today. IMPRESSION: Normal caliber spleen today by ultrasound. Overall slightly larger however compared to the prior CT scan Electronically Signed   By: Karen Kays M.D.   On: 10/09/2023 13:22   DG CHEST PORT 1 VIEW  Result Date: 10/05/2023 CLINICAL DATA:  Anemia. EXAM: PORTABLE CHEST 1 VIEW COMPARISON:  10/13/2019. FINDINGS: Low lung volume. Bilateral lung fields are clear. Bilateral lateral costophrenic angles are clear. Normal cardio-mediastinal silhouette, considering the AP technique. No acute osseous abnormalities. The soft tissues are within normal limits. IMPRESSION: No active disease. Electronically Signed   By: Jules Schick M.D.   On: 10/05/2023 07:44   CT Head Wo Contrast  Result Date: 10/04/2023 CLINICAL DATA:  Sickle cell crisis EXAM: CT HEAD WITHOUT CONTRAST TECHNIQUE: Contiguous axial images were obtained from the base of the skull through the vertex without intravenous contrast. RADIATION DOSE REDUCTION: This exam was performed according to the departmental dose-optimization program which includes automated exposure control, adjustment of the mA and/or kV according to patient size and/or use of iterative reconstruction technique. COMPARISON:  None Available. FINDINGS: Brain: No evidence of acute infarction, hemorrhage, hydrocephalus, extra-axial collection or mass lesion/mass effect. Vascular: No hyperdense vessel or unexpected  calcification. Skull: Normal. Negative for fracture or focal lesion. Sinuses/Orbits: No acute finding. Other: None IMPRESSION: Negative non contrasted CT appearance of the brain. Electronically Signed   By: Jasmine Pang M.D.   On: 10/04/2023 22:39    ASSESSMENT & PLAN ***  No orders of the defined types were placed in this encounter.   All questions were answered. The patient knows to call the clinic with any problems, questions or concerns.  A total of more than 30 minutes were spent on this encounter with face-to-face time and non-face-to-face time, including preparing to see the patient, ordering tests and/or medications, counseling the patient and coordination of care as outlined above.   Ulysees Barns, MD Department of Hematology/Oncology [facility] at [facility] Phone: [phone] Pager: [phone] Email: [email or web address]  11/02/2023 6:47 AM

## 2023-11-06 ENCOUNTER — Ambulatory Visit (HOSPITAL_COMMUNITY): Payer: 59 | Admitting: Family

## 2024-02-27 ENCOUNTER — Emergency Department (HOSPITAL_COMMUNITY): Payer: Self-pay

## 2024-02-27 ENCOUNTER — Observation Stay (HOSPITAL_COMMUNITY): Payer: Self-pay

## 2024-02-27 ENCOUNTER — Inpatient Hospital Stay (HOSPITAL_COMMUNITY)
Admission: EM | Admit: 2024-02-27 | Discharge: 2024-03-01 | DRG: 812 | Payer: Self-pay | Attending: Internal Medicine | Admitting: Internal Medicine

## 2024-02-27 DIAGNOSIS — D573 Sickle-cell trait: Secondary | ICD-10-CM

## 2024-02-27 DIAGNOSIS — Z23 Encounter for immunization: Secondary | ICD-10-CM

## 2024-02-27 DIAGNOSIS — F10929 Alcohol use, unspecified with intoxication, unspecified: Principal | ICD-10-CM

## 2024-02-27 DIAGNOSIS — F319 Bipolar disorder, unspecified: Secondary | ICD-10-CM | POA: Diagnosis present

## 2024-02-27 DIAGNOSIS — S61512A Laceration without foreign body of left wrist, initial encounter: Secondary | ICD-10-CM | POA: Diagnosis present

## 2024-02-27 DIAGNOSIS — D649 Anemia, unspecified: Secondary | ICD-10-CM

## 2024-02-27 DIAGNOSIS — R456 Violent behavior: Secondary | ICD-10-CM | POA: Diagnosis present

## 2024-02-27 DIAGNOSIS — D571 Sickle-cell disease without crisis: Secondary | ICD-10-CM | POA: Diagnosis present

## 2024-02-27 DIAGNOSIS — T07XXXA Unspecified multiple injuries, initial encounter: Secondary | ICD-10-CM

## 2024-02-27 DIAGNOSIS — Z8481 Family history of carrier of genetic disease: Secondary | ICD-10-CM

## 2024-02-27 DIAGNOSIS — S0992XA Unspecified injury of nose, initial encounter: Secondary | ICD-10-CM

## 2024-02-27 DIAGNOSIS — S0033XA Contusion of nose, initial encounter: Secondary | ICD-10-CM | POA: Diagnosis present

## 2024-02-27 DIAGNOSIS — D509 Iron deficiency anemia, unspecified: Principal | ICD-10-CM | POA: Diagnosis present

## 2024-02-27 DIAGNOSIS — Z5900 Homelessness unspecified: Secondary | ICD-10-CM

## 2024-02-27 DIAGNOSIS — Z556 Problems related to health literacy: Secondary | ICD-10-CM

## 2024-02-27 DIAGNOSIS — Y908 Blood alcohol level of 240 mg/100 ml or more: Secondary | ICD-10-CM | POA: Diagnosis present

## 2024-02-27 DIAGNOSIS — Z832 Family history of diseases of the blood and blood-forming organs and certain disorders involving the immune mechanism: Secondary | ICD-10-CM

## 2024-02-27 DIAGNOSIS — F10229 Alcohol dependence with intoxication, unspecified: Secondary | ICD-10-CM | POA: Diagnosis present

## 2024-02-27 DIAGNOSIS — Z79899 Other long term (current) drug therapy: Secondary | ICD-10-CM

## 2024-02-27 DIAGNOSIS — F17210 Nicotine dependence, cigarettes, uncomplicated: Secondary | ICD-10-CM | POA: Diagnosis present

## 2024-02-27 DIAGNOSIS — S61412A Laceration without foreign body of left hand, initial encounter: Secondary | ICD-10-CM

## 2024-02-27 DIAGNOSIS — D61818 Other pancytopenia: Secondary | ICD-10-CM | POA: Diagnosis present

## 2024-02-27 DIAGNOSIS — D638 Anemia in other chronic diseases classified elsewhere: Secondary | ICD-10-CM | POA: Diagnosis present

## 2024-02-27 LAB — CBC WITH DIFFERENTIAL/PLATELET
Abs Immature Granulocytes: 0.01 10*3/uL (ref 0.00–0.07)
Basophils Absolute: 0.1 10*3/uL (ref 0.0–0.1)
Basophils Relative: 3 %
Eosinophils Absolute: 0 10*3/uL (ref 0.0–0.5)
Eosinophils Relative: 1 %
HCT: 21.6 % — ABNORMAL LOW (ref 39.0–52.0)
Hemoglobin: 5.9 g/dL — CL (ref 13.0–17.0)
Immature Granulocytes: 0 %
Lymphocytes Relative: 33 %
Lymphs Abs: 0.9 10*3/uL (ref 0.7–4.0)
MCH: 17.4 pg — ABNORMAL LOW (ref 26.0–34.0)
MCHC: 26.9 g/dL — ABNORMAL LOW (ref 30.0–36.0)
MCV: 64.9 fL — ABNORMAL LOW (ref 80.0–100.0)
Monocytes Absolute: 0.2 10*3/uL (ref 0.1–1.0)
Monocytes Relative: 7 %
Neutro Abs: 1.5 10*3/uL — ABNORMAL LOW (ref 1.7–7.7)
Neutrophils Relative %: 56 %
Platelets: 106 10*3/uL — ABNORMAL LOW (ref 150–400)
RBC: 3.33 MIL/uL — ABNORMAL LOW (ref 4.22–5.81)
RDW: 24.8 % — ABNORMAL HIGH (ref 11.5–15.5)
WBC: 2.7 10*3/uL — ABNORMAL LOW (ref 4.0–10.5)
nRBC: 8.3 % — ABNORMAL HIGH (ref 0.0–0.2)

## 2024-02-27 LAB — COMPREHENSIVE METABOLIC PANEL
ALT: 23 U/L (ref 0–44)
AST: 52 U/L — ABNORMAL HIGH (ref 15–41)
Albumin: 3.8 g/dL (ref 3.5–5.0)
Alkaline Phosphatase: 52 U/L (ref 38–126)
Anion gap: 10 (ref 5–15)
BUN: 10 mg/dL (ref 6–20)
CO2: 24 mmol/L (ref 22–32)
Calcium: 7.8 mg/dL — ABNORMAL LOW (ref 8.9–10.3)
Chloride: 107 mmol/L (ref 98–111)
Creatinine, Ser: 0.86 mg/dL (ref 0.61–1.24)
GFR, Estimated: >60 mL/min (ref >60)
Glucose, Bld: 82 mg/dL (ref 70–99)
Potassium: 3.5 mmol/L (ref 3.5–5.1)
Sodium: 141 mmol/L (ref 135–145)
Total Bilirubin: 0.6 mg/dL (ref 0.0–1.2)
Total Protein: 6.9 g/dL (ref 6.5–8.1)

## 2024-02-27 LAB — RETICULOCYTES
Immature Retic Fract: 32.5 % — ABNORMAL HIGH (ref 2.3–15.9)
RBC.: 3.31 MIL/uL — ABNORMAL LOW (ref 4.22–5.81)
Retic Count, Absolute: 28.1 10*3/uL (ref 19.0–186.0)
Retic Ct Pct: 0.9 % (ref 0.4–3.1)

## 2024-02-27 LAB — ETHANOL: Alcohol, Ethyl (B): 483 mg/dL (ref <10)

## 2024-02-27 LAB — ACETAMINOPHEN LEVEL: Acetaminophen (Tylenol), Serum: <10 ug/mL — ABNORMAL LOW (ref 10–30)

## 2024-02-27 LAB — SALICYLATE LEVEL: Salicylate Lvl: <7 mg/dL — ABNORMAL LOW (ref 7.0–30.0)

## 2024-02-27 MED ORDER — MIDAZOLAM HCL 2 MG/2ML IJ SOLN
5.0000 mg | Freq: Once | INTRAMUSCULAR | Status: AC
  Administered 2024-02-27: 5 mg via INTRAMUSCULAR
  Filled 2024-02-27: qty 6

## 2024-02-27 MED ORDER — ACETAMINOPHEN 325 MG PO TABS: 650.0000 mg | ORAL_TABLET | Freq: Four times a day (QID) | ORAL | Status: DC | PRN

## 2024-02-27 MED ORDER — TETANUS-DIPHTH-ACELL PERTUSSIS 5-2.5-18.5 LF-MCG/0.5 IM SUSY
0.5000 mL | PREFILLED_SYRINGE | Freq: Once | INTRAMUSCULAR | Status: AC
  Administered 2024-02-27: 0.5 mL via INTRAMUSCULAR
  Filled 2024-02-27: qty 0.5

## 2024-02-27 MED ORDER — ONDANSETRON HCL 4 MG/2ML IJ SOLN: 4.0000 mg | Freq: Four times a day (QID) | INTRAMUSCULAR | Status: DC | PRN

## 2024-02-27 MED ORDER — ONDANSETRON HCL 4 MG PO TABS: 4.0000 mg | ORAL_TABLET | Freq: Four times a day (QID) | ORAL | Status: DC | PRN

## 2024-02-27 MED ORDER — THIAMINE MONONITRATE 100 MG PO TABS
100.0000 mg | ORAL_TABLET | Freq: Every day | ORAL | Status: DC
  Administered 2024-03-01: 100 mg via ORAL
  Filled 2024-02-27: qty 1

## 2024-02-27 MED ORDER — DROPERIDOL 2.5 MG/ML IJ SOLN
5.0000 mg | Freq: Once | INTRAMUSCULAR | Status: AC
  Administered 2024-02-27: 5 mg via INTRAMUSCULAR
  Filled 2024-02-27: qty 2

## 2024-02-27 MED ORDER — ACETAMINOPHEN 650 MG RE SUPP: 650.0000 mg | Freq: Four times a day (QID) | RECTAL | Status: DC | PRN

## 2024-02-27 MED ORDER — LIDOCAINE-EPINEPHRINE (PF) 2 %-1:200000 IJ SOLN
20.0000 mL | Freq: Once | INTRAMUSCULAR | Status: AC
  Administered 2024-02-27: 20 mL
  Filled 2024-02-27: qty 20

## 2024-02-27 MED ORDER — HYDROCODONE-ACETAMINOPHEN 5-325 MG PO TABS: 1.0000 | ORAL_TABLET | Freq: Four times a day (QID) | ORAL | Status: DC | PRN

## 2024-02-27 MED ORDER — SODIUM CHLORIDE 0.9 % IV BOLUS
1000.0000 mL | Freq: Once | INTRAVENOUS | Status: AC
  Administered 2024-02-27: 1000 mL via INTRAVENOUS

## 2024-02-27 MED ORDER — HALOPERIDOL LACTATE 5 MG/ML IJ SOLN
INTRAMUSCULAR | Status: AC
Start: 2024-02-27 — End: 2024-02-28
  Filled 2024-02-27: qty 1

## 2024-02-27 MED ORDER — HALOPERIDOL LACTATE 5 MG/ML IJ SOLN
2.0000 mg | Freq: Once | INTRAMUSCULAR | Status: AC
  Administered 2024-02-27: 2 mg via INTRAMUSCULAR

## 2024-02-27 MED ORDER — THIAMINE HCL 100 MG/ML IJ SOLN: 100.0000 mg | Freq: Every day | INTRAMUSCULAR | Status: DC

## 2024-02-27 MED ORDER — ADULT MULTIVITAMIN W/MINERALS CH
1.0000 | ORAL_TABLET | Freq: Every day | ORAL | Status: DC
  Administered 2024-02-28 – 2024-03-01 (×3): 1 via ORAL
  Filled 2024-02-27 (×4): qty 1

## 2024-02-27 MED ORDER — LORAZEPAM 1 MG PO TABS: 1.0000 mg | ORAL_TABLET | ORAL | Status: DC | PRN

## 2024-02-27 MED ORDER — FOLIC ACID 1 MG PO TABS
1.0000 mg | ORAL_TABLET | Freq: Every day | ORAL | Status: DC
  Filled 2024-02-27: qty 1

## 2024-02-27 MED ORDER — LORAZEPAM 2 MG/ML IJ SOLN
1.0000 mg | INTRAMUSCULAR | Status: DC | PRN
  Filled 2024-02-27: qty 1
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
## 2024-02-27 NOTE — Assessment & Plan Note (Signed)
-  review of hemoglobinopathy, sickle cell trait was confirmed in 09/2019

## 2024-02-27 NOTE — Assessment & Plan Note (Addendum)
 38 year old presenting to ED for alcohol intoxication found to have acute on chronic anemia in setting of sickle cell trait -obs to progressive in setting of alcohol intoxication/?psychosis  -baseline appears to be around 8.5 -admitted in 09/2023 for similar presentation. Work up with IDA. Hematology consulted at that time and no f/u after discharge. Refused BM biopsy during this stay  -he is refusing a blood transfusion. Discussed he could die without this, but continues to refuse.  -In setting of alcohol intoxication and refusing life saving treatment would recommend IVC if tries to leave AMA before psychiatry can see  -have consulted psychiatry for [facility] -iron studies, B12/folate and LDH pending  -? If will need IV iron or if he will consent to this  -has dried blood in his nose from trauma, but no other obvious source of bleeding.
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
## 2024-02-27 NOTE — Progress Notes (Signed)
 Patient arrived to floor from ED with 2 [employer] officers and an Charity fundraiser. In the room, patient was verbally aggressive and arguing with officers, who told him they are here to take him to jail on active warrants. Patient said he didn't want to leave. Patient refused labs, stated he would refuse a blood transfer and was resistive of care. Provider notified. Provider indicated psych would consult patient tonight to determine capacity.  An NP from psych then came to room and stated "we cannot let the patient go" tonight, he is to be IVC, until a  evaluation can be done. [employer] left a memo for patient's chart requesting we call watch commander if patient is discharged. Note with phone number is in the team care summary or in physicalchart. House Supervisor aware.

## 2024-02-27 NOTE — Progress Notes (Signed)
       Overnight   NAME: CALEM COCOZZA MRN: [id number] DOB : 08/03/1986    Date of Service   02/27/2024   HPI/Events of Note    Notified by RN for Patient having violent outburst with verbal threats and aggression toward staff. Patient refusing care, attempting to get up,potentially leave and is threatening harm to Staff.  Haldol was required in the immediate time frame due to loss of IV access. Security was notified by Staff due to aggression and threats.  Psychiatry is previously aware and NP made notation to advisement of IVC. Psychiatry Physician notified and en-route.   Interventions/ Plan   Prepare paperwork for Physician arrival.  Avoid extra contact beyond needed.      Chinita Greenland BSN MSNA MSN ACNPC-AG Acute Care Nurse Practitioner [facility]

## 2024-02-27 NOTE — Assessment & Plan Note (Signed)
-  ethanol level >400 on admit  -CIWA protocol -progressive unit for closer monitoring  -thiamine, MV, folic acid -SW consult

## 2024-02-27 NOTE — Assessment & Plan Note (Signed)
 EDP placed 5 sutures in hand laceration and 3 sutures in wrist laceration.  Dissolvable sutures

## 2024-02-27 NOTE — Assessment & Plan Note (Signed)
 Check nasal xray

## 2024-02-27 NOTE — Assessment & Plan Note (Signed)
-  per hematology work up in 09/2023 though due to profound IDA -recommended BM biopsy, but he refused -never followed up outpatient  -consider inpatient vs. Outpatient referral

## 2024-02-27 NOTE — Plan of Care (Signed)

## 2024-02-27 NOTE — ED Notes (Signed)
Unable to get temp 

## 2024-02-27 NOTE — Consult Note (Signed)
 Provider received a call from DR Everrett Coombe regarding intoxicated Patient who came in agitated, injured his hand and bleeding.  Hemoglobin is low but patient under alcohol intoxication is refusing blood transfusion.  Provider went to the unit and found patient sleeping.  Patient,  while intoxicated cannot make meaningful decision regarding his care.  Provider advised RN Shelbie Proctor to monitor CIWA score all night and make sure patient gets Ativan Coverage.  Also advised the Nurse not to allow patient leave the unit without been seen and evaluated by Provider and Psychiatrist in am.  If he tries to leave patient should be Involuntarily committed.

## 2024-02-28 DIAGNOSIS — F10920 Alcohol use, unspecified with intoxication, uncomplicated: Secondary | ICD-10-CM

## 2024-02-28 DIAGNOSIS — F319 Bipolar disorder, unspecified: Secondary | ICD-10-CM

## 2024-02-28 MED ORDER — FOLIC ACID 1 MG PO TABS
1.0000 mg | ORAL_TABLET | Freq: Every day | ORAL | Status: DC
  Administered 2024-02-28 – 2024-03-01 (×3): 1 mg via ORAL
  Filled 2024-02-28 (×3): qty 1

## 2024-02-28 MED ORDER — FERROUS SULFATE 325 (65 FE) MG PO TABS
325.0000 mg | ORAL_TABLET | Freq: Three times a day (TID) | ORAL | Status: DC
  Administered 2024-02-28 – 2024-03-01 (×6): 325 mg via ORAL
  Filled 2024-02-28 (×4): qty 1

## 2024-02-28 MED ORDER — THIAMINE MONONITRATE 100 MG PO TABS
100.0000 mg | ORAL_TABLET | Freq: Every day | ORAL | Status: DC
  Administered 2024-02-28 – 2024-03-01 (×3): 100 mg via ORAL
  Filled 2024-02-28 (×3): qty 1

## 2024-02-28 NOTE — Plan of Care (Signed)
  Problem: Pain Managment: Goal: General experience of comfort will improve and/or be controlled Outcome: Progressing   Problem: Safety: Goal: Ability to remain free from injury will improve Outcome: Progressing

## 2024-02-28 NOTE — Plan of Care (Signed)
 Patient started to take PO meds after phychiatric MD spoke with him.  Has been reasonable with me through day, non aggressive.
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
## 2024-02-29 LAB — CBC
HCT: 23 % — ABNORMAL LOW (ref 39.0–52.0)
Hemoglobin: 6.1 g/dL — CL (ref 13.0–17.0)
MCH: 17.8 pg — ABNORMAL LOW (ref 26.0–34.0)
MCHC: 26.5 g/dL — ABNORMAL LOW (ref 30.0–36.0)
MCV: 67.1 fL — ABNORMAL LOW (ref 80.0–100.0)
Platelets: 81 10*3/uL — ABNORMAL LOW (ref 150–400)
RBC: 3.43 MIL/uL — ABNORMAL LOW (ref 4.22–5.81)
RDW: 25.3 % — ABNORMAL HIGH (ref 11.5–15.5)
WBC: 4 10*3/uL (ref 4.0–10.5)
nRBC: 3.5 % — ABNORMAL HIGH (ref 0.0–0.2)

## 2024-02-29 LAB — IRON AND TIBC
Iron: 171 ug/dL (ref 45–182)
Saturation Ratios: 35 % (ref 17.9–39.5)
TIBC: 496 ug/dL — ABNORMAL HIGH (ref 250–450)
UIBC: 325 ug/dL

## 2024-02-29 LAB — HEPATITIS C ANTIBODY: HCV Ab: NONREACTIVE

## 2024-02-29 LAB — FOLATE: Folate: 16.1 ng/mL (ref >5.9)

## 2024-02-29 LAB — FERRITIN: Ferritin: 21 ng/mL — ABNORMAL LOW (ref 24–336)

## 2024-02-29 LAB — PREPARE RBC (CROSSMATCH)

## 2024-02-29 LAB — LACTATE DEHYDROGENASE: LDH: 108 U/L (ref 98–192)

## 2024-02-29 LAB — VITAMIN B12: Vitamin B-12: 409 pg/mL (ref 180–914)

## 2024-02-29 LAB — HIV ANTIBODY (ROUTINE TESTING W REFLEX): HIV Screen 4th Generation wRfx: NONREACTIVE

## 2024-02-29 MED ORDER — SODIUM CHLORIDE 0.9% IV SOLUTION: Freq: Once | INTRAVENOUS | Status: DC

## 2024-02-29 NOTE — Progress Notes (Signed)
 Pt refused AM labs. Educated pt about concern with his Hgb. Pt still refused. States "I am not concerned with that, my only concern was my hand". Provider made aware.
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
## 2024-03-01 ENCOUNTER — Other Ambulatory Visit (HOSPITAL_COMMUNITY): Payer: Self-pay

## 2024-03-01 ENCOUNTER — Other Ambulatory Visit: Payer: Self-pay

## 2024-03-01 DIAGNOSIS — F1020 Alcohol dependence, uncomplicated: Secondary | ICD-10-CM

## 2024-03-01 LAB — TYPE AND SCREEN
ABO/RH(D): O POS
Antibody Screen: NEGATIVE
Donor AG Type: NEGATIVE
Donor AG Type: NEGATIVE
Unit division: 0
Unit division: 0

## 2024-03-01 LAB — BPAM RBC
Blood Product Expiration Date: 202504042359
Blood Product Expiration Date: 202504092359
ISSUE DATE / TIME: 202503061153
ISSUE DATE / TIME: 202503061515
Unit Type and Rh: 5100
Unit Type and Rh: 5100

## 2024-03-01 LAB — CBC
HCT: 29.9 % — ABNORMAL LOW (ref 39.0–52.0)
Hemoglobin: 8.4 g/dL — ABNORMAL LOW (ref 13.0–17.0)
MCH: 20.5 pg — ABNORMAL LOW (ref 26.0–34.0)
MCHC: 28.1 g/dL — ABNORMAL LOW (ref 30.0–36.0)
MCV: 73.1 fL — ABNORMAL LOW (ref 80.0–100.0)
Platelets: 53 10*3/uL — ABNORMAL LOW (ref 150–400)
RBC: 4.09 MIL/uL — ABNORMAL LOW (ref 4.22–5.81)
RDW: 27.6 % — ABNORMAL HIGH (ref 11.5–15.5)
WBC: 5 10*3/uL (ref 4.0–10.5)
nRBC: 2.8 % — ABNORMAL HIGH (ref 0.0–0.2)

## 2024-03-01 MED ORDER — FERROUS SULFATE 325 (65 FE) MG PO TABS
325.0000 mg | ORAL_TABLET | Freq: Two times a day (BID) | ORAL | 0 refills | Status: AC
  Filled 2024-03-01: qty 120, 60d supply, fill #0

## 2024-03-01 MED ORDER — FOLIC ACID 1 MG PO TABS
1.0000 mg | ORAL_TABLET | Freq: Every day | ORAL | 0 refills | Status: AC
  Filled 2024-03-01: qty 30, 30d supply, fill #0

## 2024-03-01 MED ORDER — VITAMIN B-1 100 MG PO TABS
100.0000 mg | ORAL_TABLET | Freq: Every day | ORAL | 0 refills | Status: AC
Start: 2024-03-02 — End: ?
  Filled 2024-03-01: qty 60, 60d supply, fill #0

## 2024-03-01 NOTE — Progress Notes (Signed)
 Meds delivered from [facility] outpatient pharmacy by this RN
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
## 2024-03-01 NOTE — Plan of Care (Signed)
  Problem: Clinical Measurements: Goal: Ability to maintain clinical measurements within normal limits will improve Outcome: Progressing Goal: Diagnostic test results will improve Outcome: Progressing Goal: Cardiovascular complication will be avoided Outcome: Progressing   Problem: Coping: Goal: Level of anxiety will decrease Outcome: Progressing   Problem: Safety: Goal: Ability to remain free from injury will improve Outcome: Progressing

## 2024-03-01 NOTE — TOC Progression Note (Addendum)
 Transition of Care [facility]) - Progression Note    Patient Details  Name: Alfred Phillips MRN: [id number] Date of Birth: 1986-11-02  [facility] CM/SW Contact  Larrie Kass, LCSW Phone Number: 03/01/2024, 8:35 AM  Clinical Narrative:     Pt's change of commitment form has not been uploaded into the E-file system. CSW attempted to check the pt's shadow chart for the form, but it was not there. CSW reached out to the psychiatric provider for the signed change of commitment form. MD stated he will bring the form to the RN station. TOC to follow.  CSW received and uploaded the change of commitment form to the eCourts filing system. Pt is to be placed into [employer] custody for active warrants at d/c. No further TOC needs TOC sign-off.    Expected Discharge Plan:  (TBD) Barriers to Discharge: Continued Medical Work up  Expected Discharge Plan and Services                                               Social Determinants of Health (SDOH) Interventions SDOH Screenings   Food Insecurity: Food Insecurity Present (02/27/2024)  Housing: High Risk (02/27/2024)  Transportation Needs: Unmet Transportation Needs (02/27/2024)  Utilities: Not At Risk (02/27/2024)  Financial Resource Strain: Medium Risk (10/15/2019)  Physical Activity: Sufficiently Active (10/15/2019)  Social Connections: Unknown (10/15/2019)  Stress: No Stress Concern Present (10/15/2019)  Tobacco Use: High Risk (10/05/2023)    Readmission Risk Interventions     No data to display

## 2024-03-04 ENCOUNTER — Telehealth: Payer: Self-pay

## 2024-03-04 ENCOUNTER — Telehealth: Payer: Self-pay | Admitting: Hematology and Oncology

## 2024-03-04 NOTE — Transitions of Care (Post Inpatient/ED Visit) (Signed)
   03/04/2024  Name: Alfred Phillips MRN: [id number] DOB: 1986/09/22  Today's TOC FU Call Status: Today's TOC FU Call Status:: Unsuccessful Call (1st Attempt) Unsuccessful Call (1st Attempt) Date: 03/04/24  Attempted to reach the patient regarding the most recent Inpatient/ED visit.  Follow Up Plan: Additional outreach attempts will be made to reach the patient to complete the Transitions of Care (Post Inpatient/ED visit) call.  Hilbert Odor RN, CCM [place]  [facility] RN Care Manager [phone]

## 2024-03-05 ENCOUNTER — Ambulatory Visit (HOSPITAL_COMMUNITY): Admitting: Psychiatry

## 2024-03-05 ENCOUNTER — Telehealth: Payer: Self-pay

## 2024-03-05 NOTE — Transitions of Care (Post Inpatient/ED Visit) (Signed)
   03/05/2024  Name: Alfred Phillips MRN: [id number] DOB: 02-14-1986  Today's TOC FU Call Status:    Attempted to reach the patient regarding the most recent Inpatient/ED visit.  Follow Up Plan: Additional outreach attempts will be made to reach the patient to complete the Transitions of Care (Post Inpatient/ED visit) call.   Hilbert Odor RN, CCM [place]  [facility]Population [facility] RN Care Manager [phone]

## 2024-03-06 ENCOUNTER — Telehealth: Payer: Self-pay

## 2024-03-06 NOTE — Transitions of Care (Post Inpatient/ED Visit) (Signed)
   03/06/2024  Name: Alfred Phillips MRN: [id number] DOB: 1986/12/25  Today's TOC FU Call Status: Today's TOC FU Call Status:: Unsuccessful Call (3rd Attempt) Unsuccessful Call (2nd Attempt) Date: 03/05/24 Unsuccessful Call (3rd Attempt) Date: 03/06/24  Attempted to reach the patient regarding the most recent Inpatient/ED visit.  Follow Up Plan: No further outreach attempts will be made at this time. We have been unable to contact the patient.  Hilbert Odor RN, CCM [place]  [facility] RN Care Manager [phone]

## 2024-03-12 ENCOUNTER — Telehealth: Payer: Self-pay | Admitting: *Deleted

## 2024-03-12 ENCOUNTER — Inpatient Hospital Stay: Payer: Self-pay | Admitting: Hematology and Oncology

## 2024-03-12 ENCOUNTER — Inpatient Hospital Stay: Payer: Self-pay

## 2024-03-12 NOTE — Telephone Encounter (Signed)
 Telephone call to reach patient about missed appt today. Phone rings until it hangs up. No answer or VM
